# Patient Record
Sex: Female | Born: 1960 | Race: White | Hispanic: No | Marital: Single | Smoking: Current every day smoker
Health system: Southern US, Community
[De-identification: ages and names within clinical notes are randomized; demographics above are authoritative.]

## PROBLEM LIST (undated history)

## (undated) DIAGNOSIS — M549 Dorsalgia, unspecified: Secondary | ICD-10-CM

## (undated) HISTORY — PX: TONSILLECTOMY: SUR1361

---

## 2005-12-22 ENCOUNTER — Other Ambulatory Visit: Admission: RE | Admit: 2005-12-22 | Discharge: 2005-12-22 | Payer: Self-pay | Admitting: Family Medicine

## 2010-10-18 ENCOUNTER — Telehealth: Payer: Self-pay | Admitting: Critical Care Medicine

## 2010-10-29 NOTE — Telephone Encounter (Signed)
ONO result noted  No desaturation

## 2011-07-14 ENCOUNTER — Encounter (HOSPITAL_COMMUNITY): Payer: Self-pay | Admitting: *Deleted

## 2011-07-14 ENCOUNTER — Emergency Department (HOSPITAL_COMMUNITY)
Admission: EM | Admit: 2011-07-14 | Discharge: 2011-07-14 | Disposition: A | Discharge status: Home / Self Care | Payer: BC Managed Care – PPO | Attending: Emergency Medicine | Admitting: Emergency Medicine

## 2011-07-14 ENCOUNTER — Emergency Department (HOSPITAL_COMMUNITY): Payer: BC Managed Care – PPO

## 2011-07-14 DIAGNOSIS — R42 Dizziness and giddiness: Secondary | ICD-10-CM | POA: Insufficient documentation

## 2011-07-14 DIAGNOSIS — R51 Headache: Secondary | ICD-10-CM | POA: Insufficient documentation

## 2011-07-14 DIAGNOSIS — M542 Cervicalgia: Secondary | ICD-10-CM | POA: Insufficient documentation

## 2011-07-14 DIAGNOSIS — R0789 Other chest pain: Secondary | ICD-10-CM | POA: Insufficient documentation

## 2011-07-14 HISTORY — DX: Dorsalgia, unspecified: M54.9

## 2011-07-14 LAB — COMPREHENSIVE METABOLIC PANEL
AST: 15 U/L (ref 0–37)
Albumin: 3.6 g/dL (ref 3.5–5.2)
BUN: 20 mg/dL (ref 6–23)
Calcium: 9.1 mg/dL (ref 8.4–10.5)
Chloride: 102 mEq/L (ref 96–112)
Creatinine, Ser: 0.54 mg/dL (ref 0.50–1.10)
Total Bilirubin: 0.2 mg/dL — ABNORMAL LOW (ref 0.3–1.2)
Total Protein: 7.2 g/dL (ref 6.0–8.3)

## 2011-07-14 LAB — DIFFERENTIAL
Basophils Absolute: 0.1 10*3/uL (ref 0.0–0.1)
Basophils Relative: 1 % (ref 0–1)
Eosinophils Absolute: 0.3 10*3/uL (ref 0.0–0.7)
Eosinophils Relative: 2 % (ref 0–5)
Monocytes Absolute: 0.9 10*3/uL (ref 0.1–1.0)
Monocytes Relative: 8 % (ref 3–12)
Neutro Abs: 5.4 10*3/uL (ref 1.7–7.7)

## 2011-07-14 LAB — CBC
HCT: 38.7 % (ref 36.0–46.0)
Hemoglobin: 13.2 g/dL (ref 12.0–15.0)
MCH: 31.1 pg (ref 26.0–34.0)
MCHC: 34.1 g/dL (ref 30.0–36.0)
MCV: 91.3 fL (ref 78.0–100.0)
RDW: 13.9 % (ref 11.5–15.5)

## 2011-07-14 LAB — TROPONIN I
Troponin I: <0.3 ng/mL (ref <0.30)
Troponin I: <0.3 ng/mL (ref <0.30)

## 2011-07-14 MED ORDER — TRAMADOL HCL 50 MG PO TABS: 50.0000 mg | ORAL_TABLET | Freq: Four times a day (QID) | ORAL | Status: AC | PRN

## 2011-07-14 MED ORDER — ACETAMINOPHEN 500 MG PO TABS: 1000.0000 mg | ORAL_TABLET | Freq: Once | ORAL | Status: DC

## 2011-07-14 MED ORDER — SODIUM CHLORIDE 0.9 % IV BOLUS (SEPSIS)
1000.0000 mL | Freq: Once | INTRAVENOUS | Status: AC
  Administered 2011-07-14: 1000 mL via INTRAVENOUS

## 2011-07-14 MED ORDER — ACETAMINOPHEN 325 MG PO TABS
ORAL_TABLET | ORAL | Status: AC
  Filled 2011-07-14: qty 3

## 2011-07-14 MED ORDER — ACETAMINOPHEN 325 MG PO TABS
975.0000 mg | ORAL_TABLET | Freq: Once | ORAL | Status: AC
  Administered 2011-07-14: 975 mg via ORAL

## 2011-07-14 NOTE — ED Notes (Signed)
Pt here per [facility] with multiple complaints this am.  Pt c/o CP that woke her around 2 am .  Complains of dizziness on and off x 1 month.  Both hands numb x 1 month. Neck pain and Headache also currently.

## 2011-07-14 NOTE — ED Notes (Signed)
MD at bedside. 

## 2011-07-14 NOTE — ED Provider Notes (Signed)
History     CSN: [id number]  Arrival date & time 07/14/11  2536   First MD Initiated Contact with Patient 07/14/11 [phone]      Chief Complaint  Patient presents with  . Chest Pain  . Dizziness  . Headache    HPI The patient presents with chest pressure.  She notes that for the past 6 hours she has had pressure diffusely across her anterior chest.  The sensation developed gradually, as she was trying to sleep.  Since onset the sensation has been persistent, now with radiation superiorly.  She notes ongoing mild dyspnea.  No syncope, but the patient complains of lightheadedness.  She denies any fevers, chills, cough, pleuritic or exertional changes. The patient has one similar prior episode, that resolved spontaneously.  No cardiac imaging.  The patient endorses a history of depression.  Notably, the patient is currently being weaned from narcotic therapy, and was discharged by her primary care physician with instructions to follow up at a pain clinic. The patient smokes half-pack cigarettes daily. Past Medical History  Diagnosis Date  . Back pain     No past surgical history on file.  No family history on file.  History  Substance Use Topics  . Smoking status: Not on file  . Smokeless tobacco: Not on file  . Alcohol Use: 0.6 oz/week    1 Glasses of wine per week    OB History    Grav Para Term Preterm Abortions TAB SAB Ect Mult Living                  Review of Systems  Constitutional:       HPI  HENT:       HPI otherwise negative  Eyes: Negative.   Respiratory:       HPI, otherwise negative  Cardiovascular:       HPI, otherwise nmegative  Gastrointestinal: Negative for vomiting.  Genitourinary:       HPI, otherwise negative  Musculoskeletal:       HPI, otherwise negative  Skin: Negative.   Neurological: Negative for syncope.    Allergies  Review of patient's allergies indicates no known allergies.  Home Medications   Current Outpatient Rx  Name Route  Sig Dispense Refill  . HYDROCODONE-ACETAMINOPHEN 7.5-325 MG PO TABS Oral Take 1 tablet by mouth every 4 (four) hours as needed. For pain    . SERTRALINE HCL 100 MG PO TABS Oral Take 200 mg by mouth daily.      BP 124/74  Pulse 73  Temp(Src) 98 F (36.7 C) (Oral)  Resp 18  Ht 5\' 3"  (1.6 m)  Wt 165 lb (74.844 kg)  BMI 29.23 kg/m2  SpO2 96%  Physical Exam  Nursing note and vitals reviewed. Constitutional: She is oriented to person, place, and time. She appears well-developed and well-nourished. No distress.  HENT:  Head: Normocephalic and atraumatic.  Eyes: Conjunctivae and EOM are normal.  Cardiovascular: Normal rate and regular rhythm.   Pulmonary/Chest: Effort normal and breath sounds normal. No stridor. No respiratory distress.  Abdominal: She exhibits no distension.  Musculoskeletal: She exhibits no edema.  Neurological: She is alert and oriented to person, place, and time. No cranial nerve deficit.  Skin: Skin is warm and dry.  Psychiatric: She has a normal mood and affect.    ED Course  Procedures (including critical care time)  Labs Reviewed  CBC - Abnormal; Notable for the following:    WBC 11.5 (*)  All other components within normal limits  DIFFERENTIAL - Abnormal; Notable for the following:    Lymphs Abs 4.9 (*)    All other components within normal limits  COMPREHENSIVE METABOLIC PANEL  TROPONIN I   No results found.   No diagnosis found.  Cardiac 75 sinus rhythm normal Pulse oximetry 99% room air normal  Date: 07/14/2011  Rate: 74  Rhythm: normal sinus rhythm  QRS Axis: left  Intervals: normal  ST/T Wave abnormalities: nonspecific T wave changes  Conduction Disutrbances:none  Narrative Interpretation:   Old EKG Reviewed: none available  ABNORMAL ECG  MDM  This 51 year old female now presents with concerns of chest discomfort and neck pain.  Notably, the patient has a long history of chronic pain, and is currently in discussions with her  primary care physician regarding the necessity of management the pain specialists.  On my exam the patient is in no distress.  Given the patient's denial of any significant cardiac history, the denial of any exertional or pleuritic pain, nonischemic ECG and 2 negative troponins there is low suspicion for ongoing cardiac ischemia.  Throughout the patient's emergency room stay she was resting comfortably, in no distress with unremarkable vital signs.  Given the patient's smoking history, her description of generalized chest discomfort and mild dyspnea her some suspicion for emphysematous changes.          Gerhard Munch, MD 07/14/11 1121

## 2011-07-14 NOTE — Discharge Instructions (Signed)
It is very important that you continue to have your condition managed by your primary care physician.  As he instructed, you also need to be sure to follow-up in the pain management clinic.  Your evaluation today in the ED is most consistent with worsening lung function (likely related to your smoking history)  Please be sure to stop smoking, and to discuss further evaluation of your lungs with your physician.  If you develop any new, or concerning changes please return to the ED immediately.

## 2012-05-21 ENCOUNTER — Telehealth: Payer: Self-pay | Admitting: Nurse Practitioner

## 2012-05-21 NOTE — Telephone Encounter (Signed)
Patient was seen by Crystal Mata on 12/05/11 and now needs refills of her Zoloft 100mg  twice daily and Valium 5mg  twice daily

## 2012-05-22 ENCOUNTER — Other Ambulatory Visit: Payer: Self-pay | Admitting: Nurse Practitioner

## 2012-05-22 MED ORDER — DIAZEPAM 5 MG PO TABS: 5.0000 mg | ORAL_TABLET | Freq: Two times a day (BID) | ORAL | Status: DC | PRN

## 2012-05-22 NOTE — Telephone Encounter (Signed)
Please advise 

## 2012-05-22 NOTE — Telephone Encounter (Signed)
rx for valium. Not done yet. Called yesterday. 5 mg 2 times a day

## 2012-05-22 NOTE — Telephone Encounter (Signed)
rx ready for pick up. Tell patient to let pharmacy request next time so we can see when was filled last

## 2012-05-29 ENCOUNTER — Telehealth: Payer: Self-pay | Admitting: Physician Assistant

## 2012-05-29 NOTE — Telephone Encounter (Signed)
Called [facility] and ask if they would inform the patient ,if she calls or comes by, her Rx is ready here at our office but we have an incorrect phone number.

## 2012-05-30 NOTE — Telephone Encounter (Signed)
Prescription is still upfront.  Pharmacy has been notified to instruct patient to pick up prescription at our office if she calls back again.

## 2012-06-04 NOTE — Telephone Encounter (Signed)
Pt aware rx up front to pick up.  

## 2012-07-06 ENCOUNTER — Other Ambulatory Visit: Payer: Self-pay | Admitting: Nurse Practitioner

## 2012-07-09 ENCOUNTER — Other Ambulatory Visit: Payer: Self-pay | Admitting: *Deleted

## 2012-07-09 NOTE — Telephone Encounter (Signed)
Patient last seen in office on 12-02-11. Rx last filled on 06-04-12 for #[id number]. Please advise and if approved have nurse phone in to [facility] in [place]. Thank you

## 2012-07-09 NOTE — Telephone Encounter (Signed)
Ok to call in [employer]- Pt needs to be seen for future refills

## 2012-07-09 NOTE — Telephone Encounter (Signed)
Med called in and pt aware 

## 2012-08-08 ENCOUNTER — Other Ambulatory Visit: Payer: Self-pay | Admitting: Nurse Practitioner

## 2012-08-09 NOTE — Telephone Encounter (Signed)
Last filled 07/06/12, has appt 08/17/12 with you. Have nurse call into [facility]

## 2012-08-09 NOTE — Telephone Encounter (Signed)
Please call in diazepam rx

## 2012-08-09 NOTE — Telephone Encounter (Signed)
Called into pharmacy

## 2012-08-17 ENCOUNTER — Ambulatory Visit: Payer: Self-pay | Admitting: Nurse Practitioner

## 2012-09-05 ENCOUNTER — Other Ambulatory Visit: Payer: Self-pay | Admitting: Nurse Practitioner

## 2012-09-07 NOTE — Telephone Encounter (Signed)
Please call into pharmacy. thx 

## 2012-09-07 NOTE — Telephone Encounter (Signed)
Last seen 12/02/11   [facility]   If approved phone in and have nurse notify patient

## 2012-09-13 ENCOUNTER — Telehealth: Payer: Self-pay | Admitting: Nurse Practitioner

## 2012-09-18 ENCOUNTER — Telehealth: Payer: Self-pay | Admitting: Nurse Practitioner

## 2012-09-18 NOTE — Telephone Encounter (Signed)
Ok to refill valium X1 until sees MAE HAliburtin

## 2012-09-18 NOTE — Telephone Encounter (Signed)
Refill

## 2012-09-19 NOTE — Telephone Encounter (Signed)
Rx called to [facility], pt aware.

## 2012-10-01 ENCOUNTER — Ambulatory Visit: Payer: Self-pay | Admitting: General Practice

## 2012-10-11 ENCOUNTER — Ambulatory Visit: Payer: Self-pay | Admitting: General Practice

## 2012-10-16 ENCOUNTER — Other Ambulatory Visit: Payer: Self-pay | Admitting: Nurse Practitioner

## 2012-10-16 MED ORDER — DIAZEPAM 5 MG PO TABS: ORAL_TABLET | ORAL | Status: DC

## 2012-10-16 MED ORDER — SERTRALINE HCL 100 MG PO TABS: 200.0000 mg | ORAL_TABLET | Freq: Every day | ORAL | Status: DC

## 2012-10-16 NOTE — Telephone Encounter (Signed)
LAST OV 10/13. NTBS. LAST RF ON DIAZEPAM 09/05/12.CALL IN THE DRUG STORE IF APPROVED. THANKS.

## 2012-10-16 NOTE — Telephone Encounter (Signed)
CALL DRUG AND ASK TO FAX OVER RX REQUEST.

## 2012-10-16 NOTE — Telephone Encounter (Signed)
Please all in rx for valium but tell patient no more refills until seen

## 2012-10-16 NOTE — Telephone Encounter (Signed)
Called Valium to [facility]

## 2012-10-30 ENCOUNTER — Ambulatory Visit: Payer: Self-pay | Admitting: General Practice

## 2012-11-29 ENCOUNTER — Other Ambulatory Visit: Payer: Self-pay | Admitting: *Deleted

## 2012-11-29 NOTE — Telephone Encounter (Signed)
Patient has not been seen in office since we started Epic. Patient notified back in July that she NTBS. Gave refill once because an appt was made for 10-01-12 with Mae. Patient cancelled that appt and had appt for 8/14 and also for 9/2. She was a No show for both. Please advise. If approved please route to Pool B so nurse can phone in to [facility].

## 2012-12-20 ENCOUNTER — Other Ambulatory Visit: Payer: Self-pay

## 2012-12-21 ENCOUNTER — Telehealth: Payer: Self-pay | Admitting: Nurse Practitioner

## 2012-12-24 NOTE — Telephone Encounter (Signed)
Last seen 10/13

## 2012-12-25 NOTE — Telephone Encounter (Signed)
[place] but has not been seen since 10 /2013- must be seen to get meds

## 2012-12-25 NOTE — Telephone Encounter (Signed)
[place] Coaster R spoke with pt on 10-27.- done

## 2012-12-26 ENCOUNTER — Other Ambulatory Visit: Payer: Self-pay | Admitting: *Deleted

## 2012-12-26 MED ORDER — SERTRALINE HCL 100 MG PO TABS: 200.0000 mg | ORAL_TABLET | Freq: Every day | ORAL | Status: DC

## 2012-12-26 MED ORDER — DIAZEPAM 5 MG PO TABS: ORAL_TABLET | ORAL | Status: DC

## 2012-12-26 NOTE — Telephone Encounter (Signed)
Please call in valium rx 

## 2012-12-26 NOTE — Telephone Encounter (Signed)
REFUSED EARLIER THIS MONTH BUT LOST JOB AND CANT AFFORD TO COME IN TO BE SEEN. SEE NOTES. CALL IN THE DRUG STORE IF APPROVED. THANKS.

## 2012-12-26 NOTE — Telephone Encounter (Signed)
LAST OV 12/02/11. NTBS

## 2012-12-26 NOTE — Telephone Encounter (Signed)
NTBS.

## 2012-12-27 NOTE — Telephone Encounter (Signed)
RX called in .

## 2013-03-21 NOTE — Telephone Encounter (Signed)
[facility]

## 2013-08-16 ENCOUNTER — Ambulatory Visit (HOSPITAL_COMMUNITY)
Admission: RE | Admit: 2013-08-16 | Discharge: 2013-08-16 | Disposition: A | Discharge status: Home / Self Care | Payer: Disability Insurance | Source: Ambulatory Visit | Attending: Family Medicine | Admitting: Family Medicine

## 2013-08-16 ENCOUNTER — Other Ambulatory Visit (HOSPITAL_COMMUNITY): Payer: Self-pay | Admitting: Family Medicine

## 2013-08-16 DIAGNOSIS — M25539 Pain in unspecified wrist: Secondary | ICD-10-CM | POA: Insufficient documentation

## 2013-08-16 DIAGNOSIS — M542 Cervicalgia: Secondary | ICD-10-CM

## 2013-08-16 DIAGNOSIS — M79641 Pain in right hand: Secondary | ICD-10-CM

## 2013-08-16 DIAGNOSIS — M47817 Spondylosis without myelopathy or radiculopathy, lumbosacral region: Secondary | ICD-10-CM | POA: Insufficient documentation

## 2013-08-16 DIAGNOSIS — M79609 Pain in unspecified limb: Secondary | ICD-10-CM | POA: Insufficient documentation

## 2013-08-16 DIAGNOSIS — M79642 Pain in left hand: Secondary | ICD-10-CM

## 2013-08-16 DIAGNOSIS — M545 Low back pain: Secondary | ICD-10-CM

## 2013-08-16 DIAGNOSIS — M47812 Spondylosis without myelopathy or radiculopathy, cervical region: Secondary | ICD-10-CM | POA: Insufficient documentation

## 2014-10-29 IMAGING — CR DG HAND COMPLETE 3+V*R*
3 series · 3 of 3 positions shown · non-contrast
Comparison: None.

CLINICAL DATA: Bilateral hand and wrist pain for years with no
known injury

EXAM:
RIGHT HAND - COMPLETE 3+ VIEW

[view not recorded (1 of 3)]
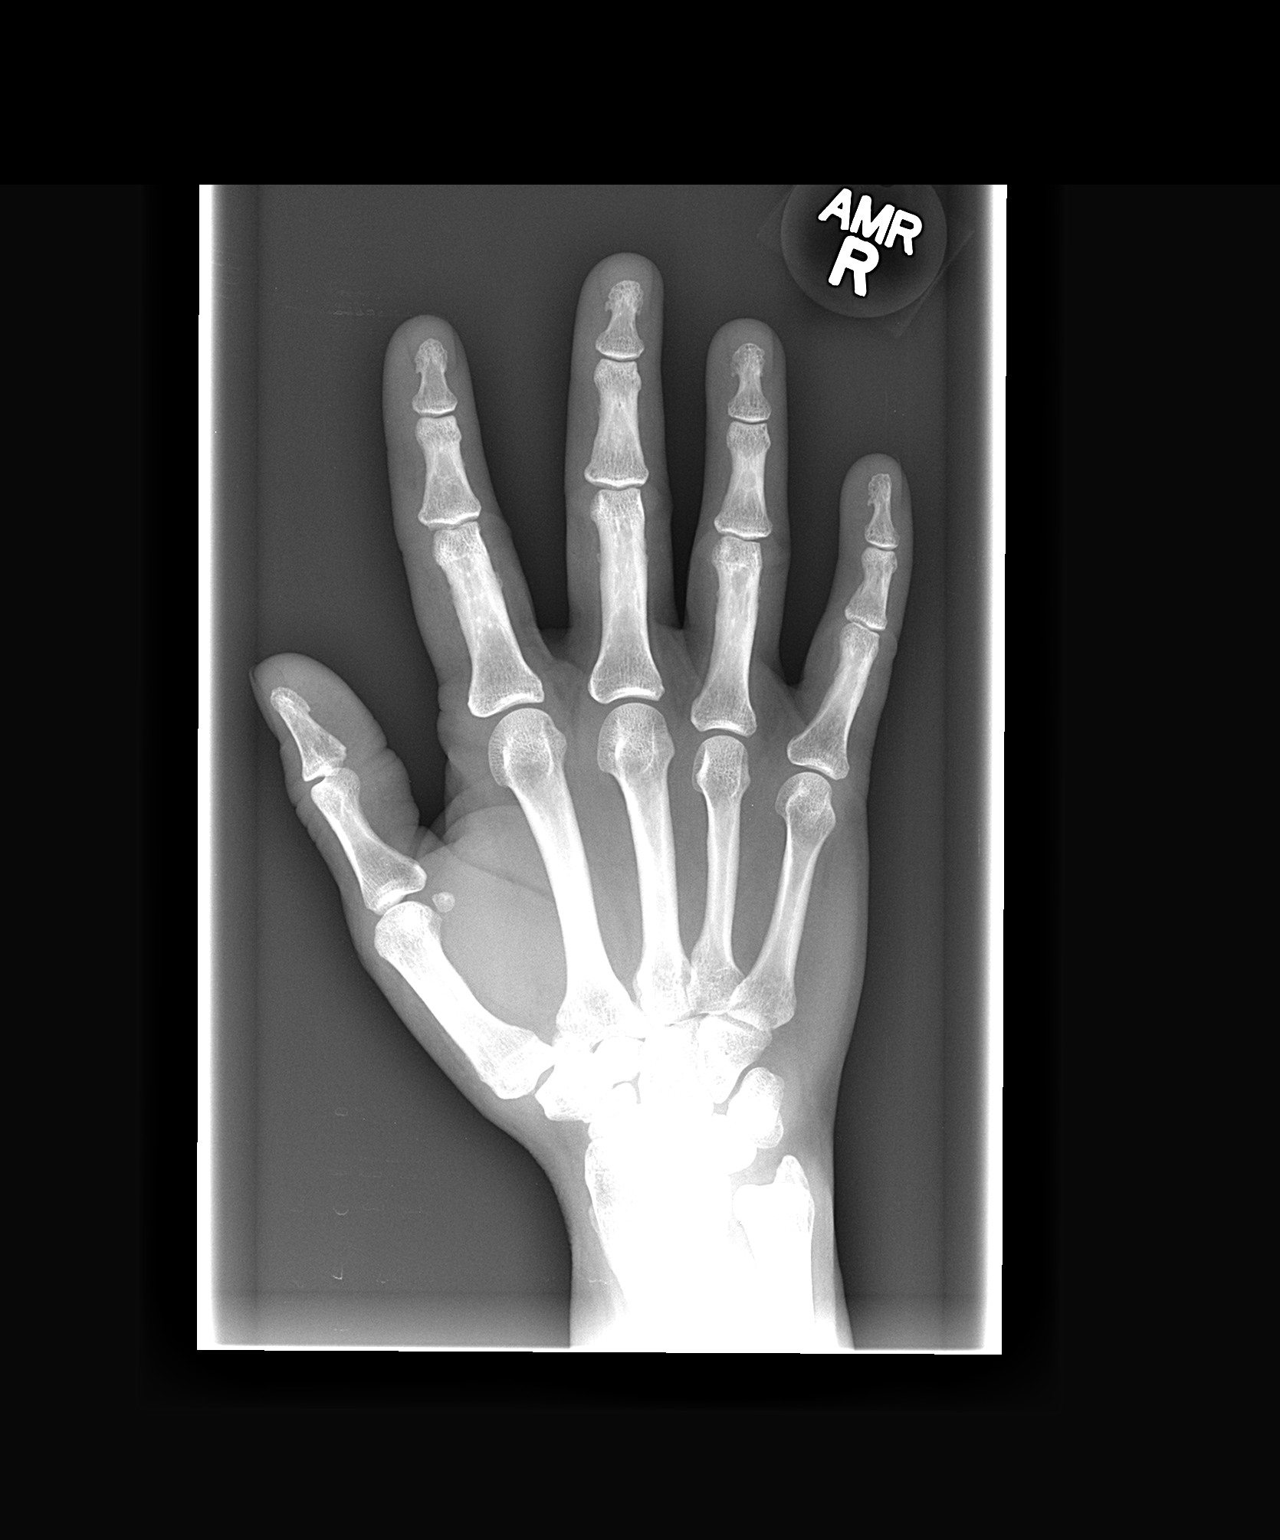

[view not recorded (2 of 3)]
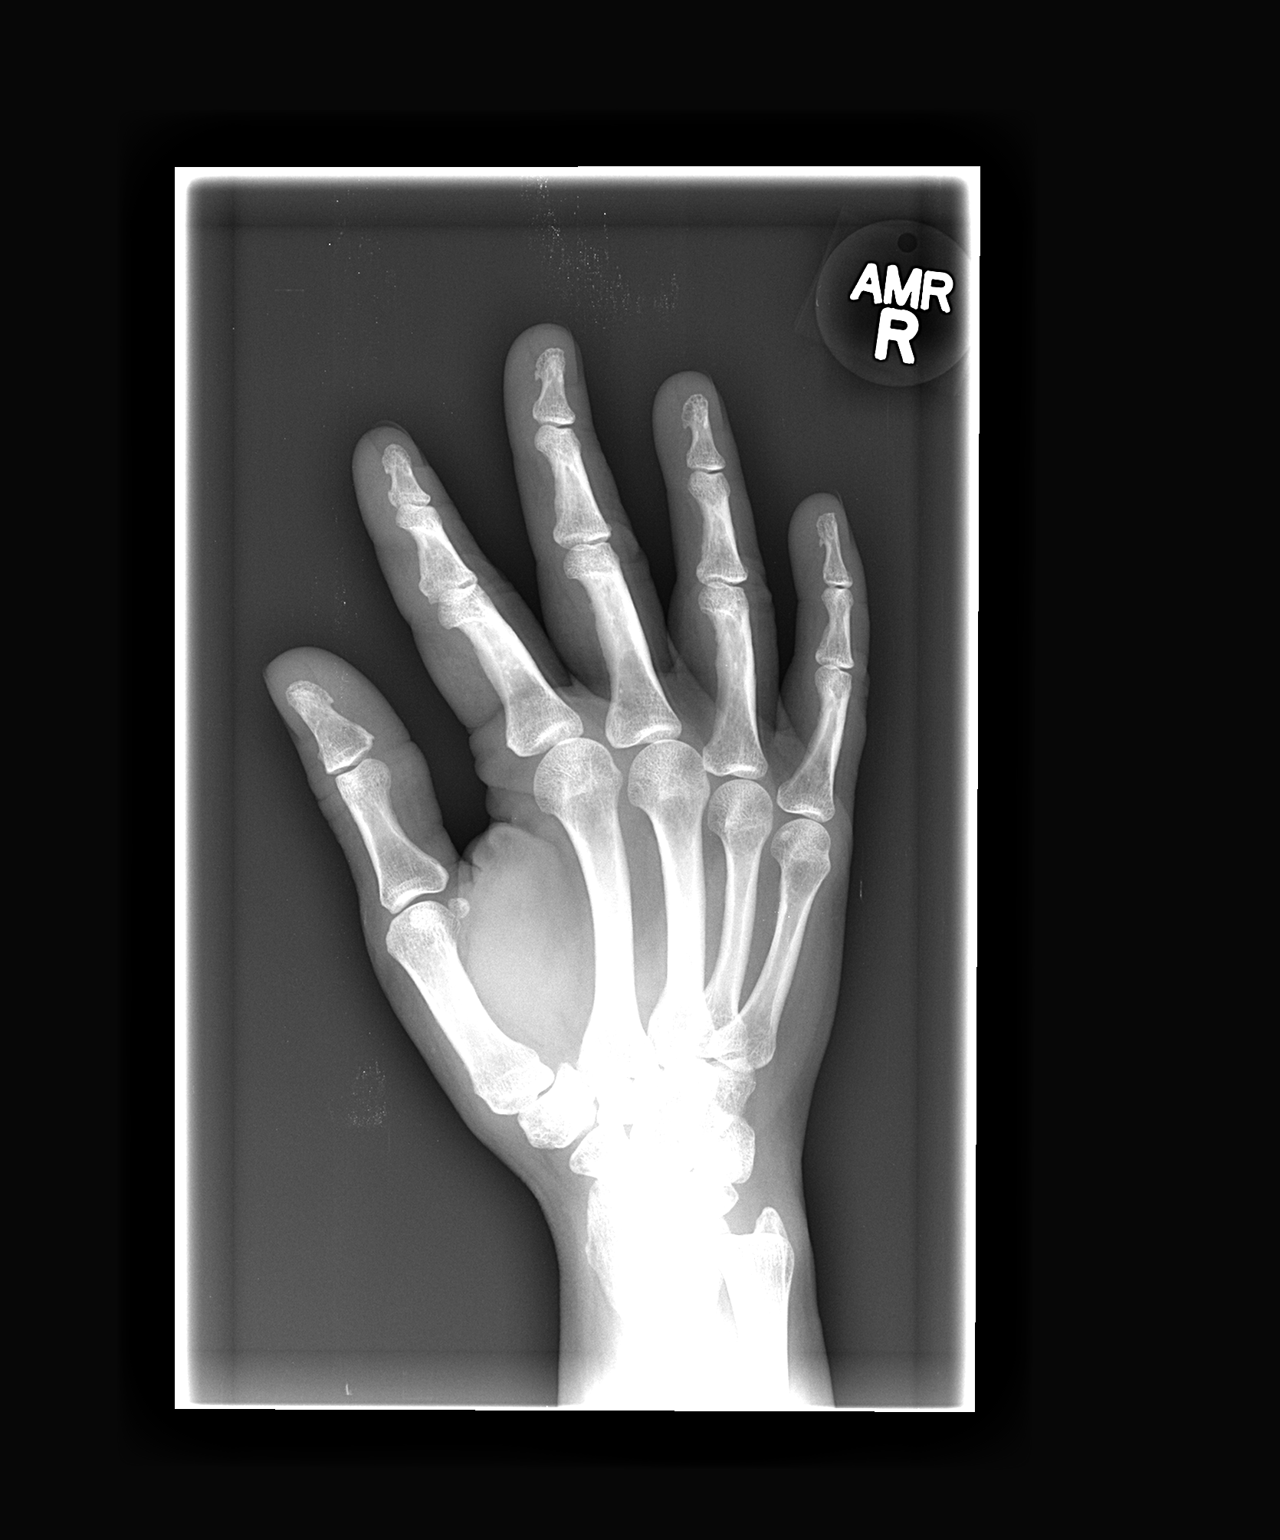

[view not recorded (3 of 3)]
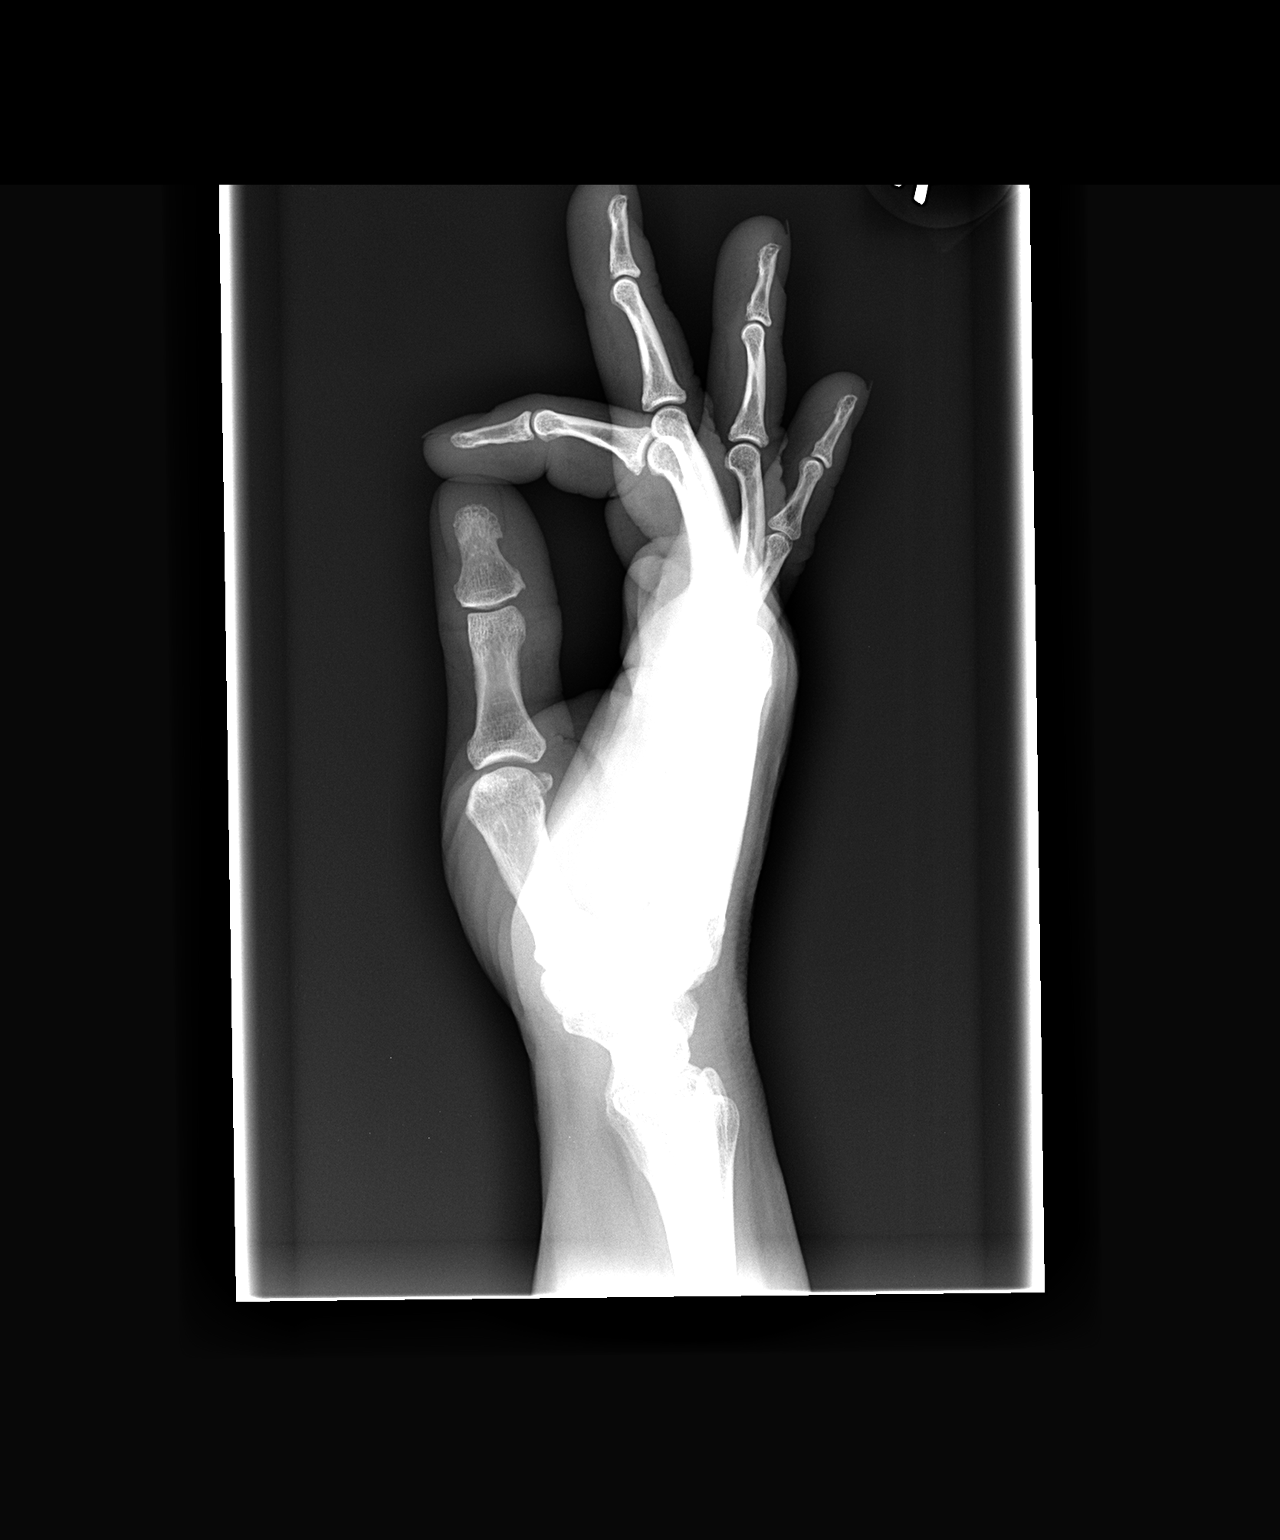

[3 of 3 positions shown; findings below may reference images not displayed]

FINDINGS: There is no fracture or dislocation. There is no focal soft tissue
abnormality. There is no significant arthropathy appreciated.
IMPRESSION: Negative

## 2016-09-23 ENCOUNTER — Ambulatory Visit (INDEPENDENT_AMBULATORY_CARE_PROVIDER_SITE_OTHER): Payer: Self-pay | Admitting: Nurse Practitioner

## 2016-09-23 ENCOUNTER — Encounter: Payer: Self-pay | Admitting: Nurse Practitioner

## 2016-09-23 VITALS — BP 120/78 | HR 83 | Temp 97.2°F | Ht 63.0 in | Wt 158.0 lb

## 2016-09-23 DIAGNOSIS — F419 Anxiety disorder, unspecified: Secondary | ICD-10-CM

## 2016-09-23 DIAGNOSIS — F331 Major depressive disorder, recurrent, moderate: Secondary | ICD-10-CM

## 2016-09-23 MED ORDER — ESCITALOPRAM OXALATE 10 MG PO TABS: 10.0000 mg | ORAL_TABLET | Freq: Every day | ORAL | 5 refills | Status: DC

## 2016-09-23 MED ORDER — ALPRAZOLAM 0.25 MG PO TABS: 0.2500 mg | ORAL_TABLET | Freq: Two times a day (BID) | ORAL | 0 refills | Status: DC | PRN

## 2016-09-23 NOTE — Patient Instructions (Signed)
Stress and Stress Management Stress is a normal reaction to life events. It is what you feel when life demands more than you are used to or more than you can handle. Some stress can be useful. For example, the stress reaction can help you catch the last bus of the day, study for a test, or meet a deadline at work. But stress that occurs too often or for too long can cause problems. It can affect your emotional health and interfere with relationships and normal daily activities. Too much stress can weaken your immune system and increase your risk for physical illness. If you already have a medical problem, stress can make it worse. What are the causes? All sorts of life events may cause stress. An event that causes stress for one person may not be stressful for another person. Major life events commonly cause stress. These may be positive or negative. Examples include losing your job, moving into a new home, getting married, having a baby, or losing a loved one. Less obvious life events may also cause stress, especially if they occur day after day or in combination. Examples include working long hours, driving in traffic, caring for children, being in debt, or being in a difficult relationship. What are the signs or symptoms? Stress may cause emotional symptoms including, the following:  Anxiety. This is feeling worried, afraid, on edge, overwhelmed, or out of control.  Anger. This is feeling irritated or impatient.  Depression. This is feeling sad, down, helpless, or guilty.  Difficulty focusing, remembering, or making decisions.  Stress may cause physical symptoms, including the following:  Aches and pains. These may affect your head, neck, back, stomach, or other areas of your body.  Tight muscles or clenched jaw.  Low energy or trouble sleeping.  Stress may cause unhealthy behaviors, including the following:  Eating to feel better (overeating) or skipping meals.  Sleeping too little,  too much, or both.  Working too much or putting off tasks (procrastination).  Smoking, drinking alcohol, or using drugs to feel better.  How is this diagnosed? Stress is diagnosed through an assessment by your health care provider. Your health care provider will ask questions about your symptoms and any stressful life events.Your health care provider will also ask about your medical history and may order blood tests or other tests. Certain medical conditions and medicine can cause physical symptoms similar to stress. Mental illness can cause emotional symptoms and unhealthy behaviors similar to stress. Your health care provider may refer you to a mental health professional for further evaluation. How is this treated? Stress management is the recommended treatment for stress.The goals of stress management are reducing stressful life events and coping with stress in healthy ways. Techniques for reducing stressful life events include the following:  Stress identification. Self-monitor for stress and identify what causes stress for you. These skills may help you to avoid some stressful events.  Time management. Set your priorities, keep a calendar of events, and learn to say "no." These tools can help you avoid making too many commitments.  Techniques for coping with stress include the following:  Rethinking the problem. Try to think realistically about stressful events rather than ignoring them or overreacting. Try to find the positives in a stressful situation rather than focusing on the negatives.  Exercise. Physical exercise can release both physical and emotional tension. The key is to find a form of exercise you enjoy and do it regularly.  Relaxation techniques. These relax the body and  mind. Examples include yoga, meditation, tai chi, biofeedback, deep breathing, progressive muscle relaxation, listening to music, being out in nature, journaling, and other hobbies. Again, the key is to find  one or more that you enjoy and can do regularly.  Healthy lifestyle. Eat a balanced diet, get plenty of sleep, and do not smoke. Avoid using alcohol or drugs to relax.  Strong support network. Spend time with family, friends, or other people you enjoy being around.Express your feelings and talk things over with someone you trust.  Counseling or talktherapy with a mental health professional may be helpful if you are having difficulty managing stress on your own. Medicine is typically not recommended for the treatment of stress.Talk to your health care provider if you think you need medicine for symptoms of stress. Follow these instructions at home:  Keep all follow-up visits as directed by your health care provider.  Take all medicines as directed by your health care provider. Contact a health care provider if:  Your symptoms get worse or you start having new symptoms.  You feel overwhelmed by your problems and can no longer manage them on your own. Get help right away if:  You feel like hurting yourself or someone else. This information is not intended to replace advice given to you by your health care provider. Make sure you discuss any questions you have with your health care provider. Document Released: 08/10/2000 Document Revised: 07/23/2015 Document Reviewed: 10/09/2012 Elsevier Interactive Patient Education  2017 Elsevier Inc.  
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
## 2016-10-03 ENCOUNTER — Telehealth: Payer: Self-pay | Admitting: Nurse Practitioner

## 2016-10-03 NOTE — Telephone Encounter (Signed)
MMM,  Pt needs a letter stating that she needs a pet / cat for therapeutic reasons.

## 2016-10-04 NOTE — Telephone Encounter (Signed)
Please write [atientletter that her cat serves as [employer] animal for her anxiety

## 2016-10-04 NOTE — Telephone Encounter (Signed)
Letter written and pt aware it is left up front.

## 2016-10-14 ENCOUNTER — Ambulatory Visit (INDEPENDENT_AMBULATORY_CARE_PROVIDER_SITE_OTHER): Payer: Self-pay | Admitting: Nurse Practitioner

## 2016-10-14 ENCOUNTER — Encounter: Payer: Self-pay | Admitting: Nurse Practitioner

## 2016-10-14 VITALS — BP 126/79 | HR 75 | Temp 97.3°F | Ht 63.0 in | Wt 157.6 lb

## 2016-10-14 DIAGNOSIS — F3341 Major depressive disorder, recurrent, in partial remission: Secondary | ICD-10-CM

## 2016-10-14 MED ORDER — CITALOPRAM HYDROBROMIDE 40 MG PO TABS: 40.0000 mg | ORAL_TABLET | Freq: Every day | ORAL | 5 refills | Status: DC
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
## 2016-10-14 NOTE — Patient Instructions (Signed)
Stress and Stress Management Stress is a normal reaction to life events. It is what you feel when life demands more than you are used to or more than you can handle. Some stress can be useful. For example, the stress reaction can help you catch the last bus of the day, study for a test, or meet a deadline at work. But stress that occurs too often or for too long can cause problems. It can affect your emotional health and interfere with relationships and normal daily activities. Too much stress can weaken your immune system and increase your risk for physical illness. If you already have a medical problem, stress can make it worse. What are the causes? All sorts of life events may cause stress. An event that causes stress for one person may not be stressful for another person. Major life events commonly cause stress. These may be positive or negative. Examples include losing your job, moving into a new home, getting married, having a baby, or losing a loved one. Less obvious life events may also cause stress, especially if they occur day after day or in combination. Examples include working long hours, driving in traffic, caring for children, being in debt, or being in a difficult relationship. What are the signs or symptoms? Stress may cause emotional symptoms including, the following:  Anxiety. This is feeling worried, afraid, on edge, overwhelmed, or out of control.  Anger. This is feeling irritated or impatient.  Depression. This is feeling sad, down, helpless, or guilty.  Difficulty focusing, remembering, or making decisions.  Stress may cause physical symptoms, including the following:  Aches and pains. These may affect your head, neck, back, stomach, or other areas of your body.  Tight muscles or clenched jaw.  Low energy or trouble sleeping.  Stress may cause unhealthy behaviors, including the following:  Eating to feel better (overeating) or skipping meals.  Sleeping too little,  too much, or both.  Working too much or putting off tasks (procrastination).  Smoking, drinking alcohol, or using drugs to feel better.  How is this diagnosed? Stress is diagnosed through an assessment by your health care provider. Your health care provider will ask questions about your symptoms and any stressful life events.Your health care provider will also ask about your medical history and may order blood tests or other tests. Certain medical conditions and medicine can cause physical symptoms similar to stress. Mental illness can cause emotional symptoms and unhealthy behaviors similar to stress. Your health care provider may refer you to a mental health professional for further evaluation. How is this treated? Stress management is the recommended treatment for stress.The goals of stress management are reducing stressful life events and coping with stress in healthy ways. Techniques for reducing stressful life events include the following:  Stress identification. Self-monitor for stress and identify what causes stress for you. These skills may help you to avoid some stressful events.  Time management. Set your priorities, keep a calendar of events, and learn to say "no." These tools can help you avoid making too many commitments.  Techniques for coping with stress include the following:  Rethinking the problem. Try to think realistically about stressful events rather than ignoring them or overreacting. Try to find the positives in a stressful situation rather than focusing on the negatives.  Exercise. Physical exercise can release both physical and emotional tension. The key is to find a form of exercise you enjoy and do it regularly.  Relaxation techniques. These relax the body and  mind. Examples include yoga, meditation, tai chi, biofeedback, deep breathing, progressive muscle relaxation, listening to music, being out in nature, journaling, and other hobbies. Again, the key is to find  one or more that you enjoy and can do regularly.  Healthy lifestyle. Eat a balanced diet, get plenty of sleep, and do not smoke. Avoid using alcohol or drugs to relax.  Strong support network. Spend time with family, friends, or other people you enjoy being around.Express your feelings and talk things over with someone you trust.  Counseling or talktherapy with a mental health professional may be helpful if you are having difficulty managing stress on your own. Medicine is typically not recommended for the treatment of stress.Talk to your health care provider if you think you need medicine for symptoms of stress. Follow these instructions at home:  Keep all follow-up visits as directed by your health care provider.  Take all medicines as directed by your health care provider. Contact a health care provider if:  Your symptoms get worse or you start having new symptoms.  You feel overwhelmed by your problems and can no longer manage them on your own. Get help right away if:  You feel like hurting yourself or someone else. This information is not intended to replace advice given to you by your health care provider. Make sure you discuss any questions you have with your health care provider. Document Released: 08/10/2000 Document Revised: 07/23/2015 Document Reviewed: 10/09/2012 Elsevier Interactive Patient Education  2017 Elsevier Inc.  

## 2016-11-14 ENCOUNTER — Telehealth: Payer: Self-pay | Admitting: Nurse Practitioner

## 2016-11-14 NOTE — Telephone Encounter (Signed)
Has to be seen to get controlled substances

## 2016-11-15 NOTE — Telephone Encounter (Signed)
Patient states that she does not have any days off this week and will call back to make an appt.

## 2016-12-21 ENCOUNTER — Telehealth: Payer: Self-pay | Admitting: Nurse Practitioner

## 2016-12-22 MED ORDER — ESCITALOPRAM OXALATE 20 MG PO TABS: 20.0000 mg | ORAL_TABLET | Freq: Every day | ORAL | 5 refills | Status: DC

## 2016-12-22 NOTE — Telephone Encounter (Signed)
Changed from celexa 40mg   To lexapro 20mg . 20mg  Is max dose of lexapro and it usually works well.

## 2016-12-22 NOTE — Telephone Encounter (Signed)
Pt aware.

## 2017-04-17 ENCOUNTER — Other Ambulatory Visit: Payer: Self-pay

## 2017-04-17 MED ORDER — ESCITALOPRAM OXALATE 20 MG PO TABS: 20.0000 mg | ORAL_TABLET | Freq: Every day | ORAL | 0 refills | Status: DC

## 2017-04-17 NOTE — Telephone Encounter (Signed)
Last seen 8/18  MMM

## 2017-04-17 NOTE — Telephone Encounter (Signed)
Last seen 10/14/16  MMM 

## 2017-04-20 ENCOUNTER — Encounter: Payer: Self-pay | Admitting: Family

## 2017-04-20 ENCOUNTER — Ambulatory Visit (INDEPENDENT_AMBULATORY_CARE_PROVIDER_SITE_OTHER): Payer: Self-pay | Admitting: Family

## 2017-04-20 VITALS — BP 130/78 | HR 83 | Temp 97.6°F | Ht 63.0 in | Wt 179.0 lb

## 2017-04-20 DIAGNOSIS — J209 Acute bronchitis, unspecified: Secondary | ICD-10-CM

## 2017-04-20 MED ORDER — HYDROCODONE-HOMATROPINE 5-1.5 MG/5ML PO SYRP: 5.0000 mL | ORAL_SOLUTION | Freq: Three times a day (TID) | ORAL | 0 refills | Status: DC | PRN

## 2017-04-20 MED ORDER — PREDNISONE 10 MG (21) PO TBPK
ORAL_TABLET | ORAL | 0 refills | Status: DC
Start: 2017-04-20 — End: 2017-12-25

## 2017-04-20 NOTE — Patient Instructions (Signed)

## 2017-04-20 NOTE — Progress Notes (Signed)
   Subjective:    Patient ID: Crystal Mata, female    DOB: 04/25/1960, 57 y.o.   MRN: [id number]  PT presents to the office today with complaints of cough that started over two weeks ago. Pt reports her son-in-law was diagnosed with the flu about the same time her symptoms started.  Cough  This is a new problem. The current episode started 1 to 4 weeks ago. The problem has been waxing and waning. The problem occurs every few minutes. The cough is non-productive. Associated symptoms include chills, headaches, myalgias, nasal congestion, rhinorrhea and a sore throat. Pertinent negatives include no ear congestion, ear pain or fever. The symptoms are aggravated by lying down. She has tried rest for the symptoms. The treatment provided mild relief. There is no history of asthma or COPD.      Review of Systems  Constitutional: Positive for chills. Negative for fever.  HENT: Positive for rhinorrhea and sore throat. Negative for ear pain.   Respiratory: Positive for cough.   Musculoskeletal: Positive for myalgias.  Neurological: Positive for headaches.  All other systems reviewed and are negative.      Objective:   Physical Exam  Constitutional: She is oriented to person, place, and time. She appears well-developed and well-nourished. No distress.  HENT:  Head: Normocephalic and atraumatic.  Right Ear: External ear normal.  Left Ear: External ear normal.  Nose: Mucosal edema and rhinorrhea present.  Mouth/Throat: Posterior oropharyngeal erythema present.  Eyes: Pupils are equal, round, and reactive to light.  Neck: Normal range of motion. Neck supple. No thyromegaly present.  Cardiovascular: Normal rate, regular rhythm, normal heart sounds and intact distal pulses.  No murmur heard. Pulmonary/Chest: Effort normal. No respiratory distress. She has wheezes.  Abdominal: Soft. Bowel sounds are normal. She exhibits no distension. There is no tenderness.  Musculoskeletal: Normal range of  motion. She exhibits no edema or tenderness.  Neurological: She is alert and oriented to person, place, and time. She has normal reflexes. No cranial nerve deficit.  Skin: Skin is warm and dry.  Psychiatric: She has a normal mood and affect. Her behavior is normal. Judgment and thought content normal.  Vitals reviewed.     BP 130/78   Pulse 83   Temp 97.6 F (36.4 C) (Oral)   Ht 5\' 3"  (1.6 m)   Wt 179 lb (81.2 kg)   BMI 31.71 kg/m      Assessment & Plan:  1. Acute bronchitis, unspecified organism - Take meds as prescribed - Use a cool mist humidifier  -Use saline nose sprays frequently -Force fluids -For any cough or congestion  Use plain Mucinex- regular strength or max strength is fine -For fever or aces or pains- take tylenol or ibuprofen. -Throat lozenges if help -New toothbrush in 3 days RTO prn  - predniSONE (STERAPRED UNI-PAK 21 TAB) 10 MG (21) TBPK tablet; Use as directed  Dispense: 21 tablet; Refill: 0 - HYDROcodone-homatropine (HYCODAN) 5-1.5 MG/5ML syrup; Take 5 mLs by mouth every 8 (eight) hours as needed for cough.  Dispense: 120 mL; Refill: 0    Jannifer Rodneyhristy Thierno Hun, FNP

## 2017-05-12 ENCOUNTER — Other Ambulatory Visit: Payer: Self-pay | Admitting: *Deleted

## 2017-06-19 ENCOUNTER — Other Ambulatory Visit: Payer: Self-pay | Admitting: *Deleted

## 2017-07-19 ENCOUNTER — Other Ambulatory Visit: Payer: Self-pay | Admitting: Nurse Practitioner

## 2017-07-21 ENCOUNTER — Other Ambulatory Visit: Payer: Self-pay | Admitting: Nurse Practitioner

## 2017-07-25 ENCOUNTER — Other Ambulatory Visit: Payer: Self-pay | Admitting: Nurse Practitioner

## 2017-07-25 NOTE — Telephone Encounter (Signed)
Lexapro was refilled on 5/24 and receipt confirmation received. Patient aware and will make appt for follow up soon.

## 2017-08-16 ENCOUNTER — Other Ambulatory Visit: Payer: Self-pay | Admitting: Nurse Practitioner

## 2017-08-16 MED ORDER — ESCITALOPRAM OXALATE 20 MG PO TABS: 20.0000 mg | ORAL_TABLET | Freq: Every day | ORAL | 0 refills | Status: DC

## 2017-08-16 NOTE — Telephone Encounter (Signed)
Pt aware 30 day refill has been sent to pharmacy

## 2017-08-17 NOTE — Telephone Encounter (Signed)
Last seen 04/20/17

## 2017-09-18 ENCOUNTER — Ambulatory Visit: Payer: Self-pay | Admitting: Nurse Practitioner

## 2017-11-20 ENCOUNTER — Other Ambulatory Visit: Payer: Self-pay | Admitting: Nurse Practitioner

## 2017-11-20 NOTE — Telephone Encounter (Signed)
Patient  Notified NTBS

## 2017-11-20 NOTE — Telephone Encounter (Signed)
MMM. NTBS. 30 day given 08/16/17. July appt cancelled

## 2017-12-13 ENCOUNTER — Telehealth: Payer: Self-pay | Admitting: *Deleted

## 2017-12-13 MED ORDER — ESCITALOPRAM OXALATE 20 MG PO TABS: 20.0000 mg | ORAL_TABLET | Freq: Every day | ORAL | 0 refills | Status: DC

## 2017-12-13 NOTE — Telephone Encounter (Signed)
Aware refill sent 

## 2017-12-25 ENCOUNTER — Encounter: Payer: Self-pay | Admitting: Nurse Practitioner

## 2017-12-25 ENCOUNTER — Ambulatory Visit (INDEPENDENT_AMBULATORY_CARE_PROVIDER_SITE_OTHER): Payer: Self-pay | Admitting: Nurse Practitioner

## 2017-12-25 VITALS — BP 118/70 | HR 94 | Temp 97.0°F | Ht 63.0 in | Wt 186.0 lb

## 2017-12-25 DIAGNOSIS — F419 Anxiety disorder, unspecified: Secondary | ICD-10-CM | POA: Insufficient documentation

## 2017-12-25 DIAGNOSIS — F410 Panic disorder [episodic paroxysmal anxiety] without agoraphobia: Secondary | ICD-10-CM | POA: Insufficient documentation

## 2017-12-25 DIAGNOSIS — F339 Major depressive disorder, recurrent, unspecified: Secondary | ICD-10-CM

## 2017-12-25 MED ORDER — ESCITALOPRAM OXALATE 20 MG PO TABS: 20.0000 mg | ORAL_TABLET | Freq: Every day | ORAL | 0 refills | Status: DC

## 2017-12-25 MED ORDER — BUSPIRONE HCL 10 MG PO TABS: 10.0000 mg | ORAL_TABLET | Freq: Two times a day (BID) | ORAL | 1 refills | Status: DC

## 2017-12-25 NOTE — Patient Instructions (Signed)
Stress and Stress Management Stress is a normal reaction to life events. It is what you feel when life demands more than you are used to or more than you can handle. Some stress can be useful. For example, the stress reaction can help you catch the last bus of the day, study for a test, or meet a deadline at work. But stress that occurs too often or for too long can cause problems. It can affect your emotional health and interfere with relationships and normal daily activities. Too much stress can weaken your immune system and increase your risk for physical illness. If you already have a medical problem, stress can make it worse. What are the causes? All sorts of life events may cause stress. An event that causes stress for one person may not be stressful for another person. Major life events commonly cause stress. These may be positive or negative. Examples include losing your job, moving into a new home, getting married, having a baby, or losing a loved one. Less obvious life events may also cause stress, especially if they occur day after day or in combination. Examples include working long hours, driving in traffic, caring for children, being in debt, or being in a difficult relationship. What are the signs or symptoms? Stress may cause emotional symptoms including, the following:  Anxiety. This is feeling worried, afraid, on edge, overwhelmed, or out of control.  Anger. This is feeling irritated or impatient.  Depression. This is feeling sad, down, helpless, or guilty.  Difficulty focusing, remembering, or making decisions.  Stress may cause physical symptoms, including the following:  Aches and pains. These may affect your head, neck, back, stomach, or other areas of your body.  Tight muscles or clenched jaw.  Low energy or trouble sleeping.  Stress may cause unhealthy behaviors, including the following:  Eating to feel better (overeating) or skipping meals.  Sleeping too little,  too much, or both.  Working too much or putting off tasks (procrastination).  Smoking, drinking alcohol, or using drugs to feel better.  How is this diagnosed? Stress is diagnosed through an assessment by your health care provider. Your health care provider will ask questions about your symptoms and any stressful life events.Your health care provider will also ask about your medical history and may order blood tests or other tests. Certain medical conditions and medicine can cause physical symptoms similar to stress. Mental illness can cause emotional symptoms and unhealthy behaviors similar to stress. Your health care provider may refer you to a mental health professional for further evaluation. How is this treated? Stress management is the recommended treatment for stress.The goals of stress management are reducing stressful life events and coping with stress in healthy ways. Techniques for reducing stressful life events include the following:  Stress identification. Self-monitor for stress and identify what causes stress for you. These skills may help you to avoid some stressful events.  Time management. Set your priorities, keep a calendar of events, and learn to say "no." These tools can help you avoid making too many commitments.  Techniques for coping with stress include the following:  Rethinking the problem. Try to think realistically about stressful events rather than ignoring them or overreacting. Try to find the positives in a stressful situation rather than focusing on the negatives.  Exercise. Physical exercise can release both physical and emotional tension. The key is to find a form of exercise you enjoy and do it regularly.  Relaxation techniques. These relax the body and  mind. Examples include yoga, meditation, tai chi, biofeedback, deep breathing, progressive muscle relaxation, listening to music, being out in nature, journaling, and other hobbies. Again, the key is to find  one or more that you enjoy and can do regularly.  Healthy lifestyle. Eat a balanced diet, get plenty of sleep, and do not smoke. Avoid using alcohol or drugs to relax.  Strong support network. Spend time with family, friends, or other people you enjoy being around.Express your feelings and talk things over with someone you trust.  Counseling or talktherapy with a mental health professional may be helpful if you are having difficulty managing stress on your own. Medicine is typically not recommended for the treatment of stress.Talk to your health care provider if you think you need medicine for symptoms of stress. Follow these instructions at home:  Keep all follow-up visits as directed by your health care provider.  Take all medicines as directed by your health care provider. Contact a health care provider if:  Your symptoms get worse or you start having new symptoms.  You feel overwhelmed by your problems and can no longer manage them on your own. Get help right away if:  You feel like hurting yourself or someone else. This information is not intended to replace advice given to you by your health care provider. Make sure you discuss any questions you have with your health care provider. Document Released: 08/10/2000 Document Revised: 07/23/2015 Document Reviewed: 10/09/2012 Elsevier Interactive Patient Education  2017 Elsevier Inc.  

## 2017-12-25 NOTE — Progress Notes (Signed)
   Subjective:    Patient ID: Crystal Mata, female    DOB: May 20, 1960, 57 y.o.   MRN: [id number]   Chief Complaint: Medical Management of Chronic Issues   HPI:  1. Depression, recurrent (HCC)  Patient is currently on lexapro. Not working as well as it was. Still getting anxious and having panic attacks. She is under a lot of stress that is causing the attacks. Having panic attack at least 3x a week. Usually situational.  2. Anxiety  She has been out of xanax for awhile . We had given it to her temporarily while waitin for lexapro to take affect.    Outpatient Encounter Medications as of 12/25/2017  Medication Sig  . escitalopram (LEXAPRO) 20 MG tablet Take 1 tablet (20 mg total) by mouth daily.  Marland Kitchen ALPRAZolam (XANAX) 0.25 MG tablet Take 1 tablet (0.25 mg total) by mouth 2 (two) times daily as needed for anxiety. (Patient not taking: Reported on 12/25/2017)       New complaints: None today  Social history: Works at [employer] for her family   Review of Systems  Constitutional: Negative for activity change and appetite change.  HENT: Negative.   Eyes: Negative for pain.  Respiratory: Negative for shortness of breath.   Cardiovascular: Negative for chest pain, palpitations and leg swelling.  Gastrointestinal: Negative for abdominal pain.  Endocrine: Negative for polydipsia.  Genitourinary: Negative.   Skin: Negative for rash.  Neurological: Negative for dizziness, weakness and headaches.  Hematological: Does not bruise/bleed easily.  Psychiatric/Behavioral: Negative.   All other systems reviewed and are negative.      Objective:   Physical Exam  Constitutional: She appears well-developed and well-nourished. No distress.  Cardiovascular: Normal rate and regular rhythm.  Pulmonary/Chest: Effort normal and breath sounds normal.  Neurological: She is alert.  Skin: Skin is warm.  Psychiatric: She has a normal mood and affect. Her behavior is normal. Thought  content normal.  Nursing note and vitals reviewed.  BP 118/70   Pulse 94   Temp (!) 97 F (36.1 C) (Oral)   Ht 5\' 3"  (1.6 m)   Wt 186 lb (84.4 kg)   BMI 32.95 kg/m         Assessment & Plan:  Crystal Mata in today with chief complaint of Medical Management of Chronic Issues   1. Depression, recurrent (HCC) - escitalopram (LEXAPRO) 20 MG tablet; Take 1 tablet (20 mg total) by mouth daily.  Dispense: 30 tablet; Refill: 0  2. Anxiety - busPIRone (BUSPAR) 10 MG tablet; Take 1 tablet (10 mg total) by mouth 2 (two) times daily.  Dispense: 60 tablet; Refill: 1  3. Panic attack Stress management exercise  RTO Prn  Mary-Margaret Daphine Deutscher, FNP

## 2018-01-16 ENCOUNTER — Other Ambulatory Visit: Payer: Self-pay | Admitting: Nurse Practitioner

## 2018-01-16 DIAGNOSIS — F419 Anxiety disorder, unspecified: Secondary | ICD-10-CM

## 2018-02-08 ENCOUNTER — Other Ambulatory Visit: Payer: Self-pay | Admitting: Nurse Practitioner

## 2018-02-08 MED ORDER — CLONAZEPAM 0.5 MG PO TABS
0.5000 mg | ORAL_TABLET | Freq: Two times a day (BID) | ORAL | 1 refills | Status: DC | PRN
Start: 2018-02-08 — End: 2018-02-19

## 2018-02-08 NOTE — Telephone Encounter (Signed)
Pt aware and scheduled for f/u 12/23 with MMM.

## 2018-02-08 NOTE — Telephone Encounter (Signed)
Will send in klonopin low dose for 1 week but will need to be seen for refill

## 2018-02-08 NOTE — Telephone Encounter (Signed)
Please review and advise.

## 2018-02-19 ENCOUNTER — Ambulatory Visit (INDEPENDENT_AMBULATORY_CARE_PROVIDER_SITE_OTHER): Payer: Self-pay | Admitting: Nurse Practitioner

## 2018-02-19 ENCOUNTER — Encounter: Payer: Self-pay | Admitting: Nurse Practitioner

## 2018-02-19 VITALS — BP 144/79 | HR 73 | Temp 97.1°F | Ht 63.0 in | Wt 188.0 lb

## 2018-02-19 DIAGNOSIS — F339 Major depressive disorder, recurrent, unspecified: Secondary | ICD-10-CM

## 2018-02-19 DIAGNOSIS — F410 Panic disorder [episodic paroxysmal anxiety] without agoraphobia: Secondary | ICD-10-CM

## 2018-02-19 DIAGNOSIS — F419 Anxiety disorder, unspecified: Secondary | ICD-10-CM

## 2018-02-19 MED ORDER — CLONAZEPAM 0.5 MG PO TABS: 0.5000 mg | ORAL_TABLET | Freq: Two times a day (BID) | ORAL | 5 refills | Status: DC | PRN

## 2018-02-19 MED ORDER — ESCITALOPRAM OXALATE 20 MG PO TABS: 20.0000 mg | ORAL_TABLET | Freq: Every day | ORAL | 0 refills | Status: DC

## 2018-02-19 NOTE — Progress Notes (Signed)
Subjective:    Patient ID: Crystal Mata Chestnutt, female    DOB: 10/24/1960, 57 y.o.   MRN: [id number]   Chief Complaint: Medical Management of Chronic Issues   HPI:  1. Panic attack   2. Depression, recurrent (HCC)   3. Anxiety     Outpatient Encounter Medications as of 02/19/2018  Medication Sig  . clonazePAM (KLONOPIN) 0.5 MG tablet Take 1 tablet (0.5 mg total) by mouth 2 (two) times daily as needed for anxiety.  Marland Kitchen. escitalopram (LEXAPRO) 20 MG tablet Take 1 tablet (20 mg total) by mouth daily.   No facility-administered encounter medications on file as of 02/19/2018.    * patient is doing well today. She is currently on lexapro and klonopin. She has been taking klonopin daily usually early in morning. It really helps her stress level throughout the day.  [facility] 02/19/2018 12/25/2017 04/20/2017  Decreased Interest 1 2 0  Down, Depressed, Hopeless 1 2 0  PHQ - 2 Score 2 4 0  Altered sleeping 1 2 -  Tired, decreased energy 1 2 -  Change in appetite 1 2 -  Feeling bad or failure about yourself  1 3 -  Trouble concentrating 1 2 -  Moving slowly or fidgety/restless 1 3 -  Suicidal thoughts 0 0 -  PHQ-9 Score 8 18 -  Difficult doing work/chores - - -      New complaints: None today  Social history: Works as Administratorcook and waitress in [employer]   Review of Systems  Constitutional: Negative for activity change and appetite change.  HENT: Negative.   Eyes: Negative for pain.  Respiratory: Negative for shortness of breath.   Cardiovascular: Negative for chest pain, palpitations and leg swelling.  Gastrointestinal: Negative for abdominal pain.  Endocrine: Negative for polydipsia.  Genitourinary: Negative.   Skin: Negative for rash.  Neurological: Negative for dizziness, weakness and headaches.  Hematological: Does not bruise/bleed easily.  Psychiatric/Behavioral: Negative.   All other systems reviewed and are negative.      Objective:   Physical  Exam Constitutional:      Appearance: Normal appearance. She is obese.  Neck:     Musculoskeletal: Normal range of motion.  Cardiovascular:     Rate and Rhythm: Normal rate and regular rhythm.     Pulses: Normal pulses.     Heart sounds: Normal heart sounds.  Skin:    General: Skin is warm and dry.  Neurological:     General: No focal deficit present.     Mental Status: She is alert and oriented to person, place, and time.  Psychiatric:        Mood and Affect: Mood normal.        Behavior: Behavior normal.        Thought Content: Thought content normal.        Judgment: Judgment normal.    BP (!) 144/79   Pulse 73   Temp (!) 97.1 F (36.2 C) (Oral)   Ht 5\' 3"  (1.6 m)   Wt 188 lb (85.3 kg)   BMI 33.30 kg/m          Assessment & Plan:  Crystal Mata Muniz comes in today with chief complaint of Medical Management of Chronic Issues   Diagnosis and orders addressed:  1. Panic attack  2. Depression, recurrent (HCC) Stress management - escitalopram (LEXAPRO) 20 MG tablet; Take 1 tablet (20 mg total) by mouth daily.  Dispense: 30 tablet; Refill: 0  3. Anxiety -  clonazePAM (KLONOPIN) 0.5 MG tablet; Take 1 tablet (0.5 mg total) by mouth 2 (two) times daily as needed for anxiety.  Dispense: 30 tablet; Refill: 5   Labs pending Health Maintenance reviewed Diet and exercise encouraged  Follow up plan: 3 months   Mary-Margaret Daphine DeutscherMartin, FNP

## 2018-02-19 NOTE — Patient Instructions (Signed)

## 2018-04-13 ENCOUNTER — Other Ambulatory Visit: Payer: Self-pay | Admitting: Nurse Practitioner

## 2018-04-13 DIAGNOSIS — F339 Major depressive disorder, recurrent, unspecified: Secondary | ICD-10-CM

## 2018-06-12 ENCOUNTER — Other Ambulatory Visit: Payer: Self-pay | Admitting: Nurse Practitioner

## 2018-06-12 DIAGNOSIS — F339 Major depressive disorder, recurrent, unspecified: Secondary | ICD-10-CM

## 2018-07-07 ENCOUNTER — Other Ambulatory Visit: Payer: Self-pay | Admitting: Nurse Practitioner

## 2018-07-07 DIAGNOSIS — F339 Major depressive disorder, recurrent, unspecified: Secondary | ICD-10-CM

## 2018-07-09 NOTE — Telephone Encounter (Signed)
Needs to be seen

## 2018-08-09 ENCOUNTER — Other Ambulatory Visit: Payer: Self-pay | Admitting: Nurse Practitioner

## 2018-08-09 DIAGNOSIS — F339 Major depressive disorder, recurrent, unspecified: Secondary | ICD-10-CM

## 2018-08-16 ENCOUNTER — Other Ambulatory Visit: Payer: Self-pay | Admitting: Nurse Practitioner

## 2018-08-16 DIAGNOSIS — F339 Major depressive disorder, recurrent, unspecified: Secondary | ICD-10-CM

## 2018-08-16 DIAGNOSIS — F419 Anxiety disorder, unspecified: Secondary | ICD-10-CM

## 2018-08-17 NOTE — Telephone Encounter (Signed)
MMM. NTBS 30 days given 07/09/18

## 2018-08-17 NOTE — Telephone Encounter (Signed)
lmtcb to schedule appt 

## 2018-08-21 ENCOUNTER — Other Ambulatory Visit: Payer: Self-pay | Admitting: Nurse Practitioner

## 2018-08-21 ENCOUNTER — Telehealth: Payer: Self-pay | Admitting: Nurse Practitioner

## 2018-08-21 DIAGNOSIS — F339 Major depressive disorder, recurrent, unspecified: Secondary | ICD-10-CM

## 2018-08-21 DIAGNOSIS — F419 Anxiety disorder, unspecified: Secondary | ICD-10-CM

## 2018-08-23 MED ORDER — CLONAZEPAM 0.5 MG PO TABS: 0.5000 mg | ORAL_TABLET | Freq: Two times a day (BID) | ORAL | 5 refills | Status: DC | PRN

## 2018-08-23 NOTE — Telephone Encounter (Signed)
Refilled klonopin

## 2018-09-03 ENCOUNTER — Ambulatory Visit (INDEPENDENT_AMBULATORY_CARE_PROVIDER_SITE_OTHER): Payer: Self-pay | Admitting: Nurse Practitioner

## 2018-09-03 ENCOUNTER — Encounter: Payer: Self-pay | Admitting: Nurse Practitioner

## 2018-09-03 ENCOUNTER — Other Ambulatory Visit: Payer: Self-pay

## 2018-09-03 DIAGNOSIS — F419 Anxiety disorder, unspecified: Secondary | ICD-10-CM

## 2018-09-03 DIAGNOSIS — F410 Panic disorder [episodic paroxysmal anxiety] without agoraphobia: Secondary | ICD-10-CM

## 2018-09-03 DIAGNOSIS — F339 Major depressive disorder, recurrent, unspecified: Secondary | ICD-10-CM

## 2018-09-03 MED ORDER — ESCITALOPRAM OXALATE 20 MG PO TABS: 20.0000 mg | ORAL_TABLET | Freq: Every day | ORAL | 5 refills | Status: DC

## 2018-09-03 MED ORDER — BUPROPION HCL ER (XL) 150 MG PO TB24: 150.0000 mg | ORAL_TABLET | Freq: Every day | ORAL | 5 refills | Status: DC

## 2018-09-03 NOTE — Progress Notes (Signed)
   Virtual Visit via telephone Note  I connected with Crystal Mata on 09/03/18 at 2:45 by telephone and verified that I am speaking with the correct person using two identifiers. Crystal Mata is currently located at home and  Non one is currently with her during visit. The provider, [name] Done, FNP is located in their office at time of visit.  I discussed the limitations, risks, security and privacy concerns of performing an evaluation and management service by telephone and the availability of in person appointments. I also discussed with the patient that there may be a patient responsible charge related to this service. The patient expressed understanding and agreed to proceed.   History and Present Illness:   Chief Complaint: Medical Management of Chronic Issues    HPI:  1. Depression, recurrent ([place])  2. Anxiety  3. Panic attack  * patient is having a hard time right now. Her daughter has been very disrespecful to her and her boss at work has been giving her a hard time. They all work together which makes things more difficult.   Outpatient Encounter Medications as of 09/03/2018  Medication Sig  . clonazePAM (KLONOPIN) 0.5 MG tablet Take 1 tablet (0.5 mg total) by mouth 2 (two) times daily as needed for anxiety.  Marland Kitchen escitalopram (LEXAPRO) 20 MG tablet TAKE 1 TABLET (20 MG TOTAL) BY MOUTH DAILY. (NEEDS TO BE SEEN BEFORE NEXT REFILL)        New complaints: none  Social history: Works at [employer] with her family  Controlled substance contract: need to get the next time she come sin to office.     Review of Systems  Constitutional: Negative.   Respiratory: Negative.   Cardiovascular: Negative.   Genitourinary: Negative.   Skin: Negative.   Neurological: Negative.   Psychiatric/Behavioral: Negative.   All other systems reviewed and are negative.    Observations/Objective: Alert and oriented- ask all questions appropriately patient very tearful   Assessment and Plan: Crystal Mata comes in today with chief complaint of Medical Management of Chronic Issues   Diagnosis and orders addressed:  1. Depression, recurrent ([place]) stress management - buPROPion (WELLBUTRIN XL) 150 MG 24 hr tablet; Take 1 tablet (150 mg total) by mouth daily.  Dispense: 30 tablet; Refill: 5 - escitalopram (LEXAPRO) 20 MG tablet; Take 1 tablet (20 mg total) by mouth daily. (Needs to be seen before next refill)  Dispense: 30 tablet; Refill: 5  2. Anxiety  3. Panic attack   Labs pending Health Maintenance reviewed Diet and exercise encouraged  Follow up plan: 6 months     I discussed the assessment and treatment plan with the patient. The patient was provided an opportunity to ask questions and all were answered. The patient agreed with the plan and demonstrated an understanding of the instructions.   The patient was advised to call back or seek an in-person evaluation if the symptoms worsen or if the condition fails to improve as anticipated.  The above assessment and management plan was discussed with the patient. The patient verbalized understanding of and has agreed to the management plan. Patient is aware to call the clinic if symptoms persist or worsen. Patient is aware when to return to the clinic for a follow-up visit. Patient educated on when it is appropriate to go to the emergency department.   Time call ended:  3:00  I provided 15 minutes of non-face-to-face time during this encounter.    [name] Done, FNP

## 2018-09-08 ENCOUNTER — Other Ambulatory Visit: Payer: Self-pay | Admitting: Nurse Practitioner

## 2018-09-08 DIAGNOSIS — F339 Major depressive disorder, recurrent, unspecified: Secondary | ICD-10-CM

## 2019-04-16 ENCOUNTER — Other Ambulatory Visit: Payer: Self-pay | Admitting: Nurse Practitioner

## 2019-04-16 DIAGNOSIS — F339 Major depressive disorder, recurrent, unspecified: Secondary | ICD-10-CM

## 2019-05-13 ENCOUNTER — Other Ambulatory Visit: Payer: Self-pay | Admitting: Nurse Practitioner

## 2019-05-13 DIAGNOSIS — F339 Major depressive disorder, recurrent, unspecified: Secondary | ICD-10-CM

## 2019-05-27 ENCOUNTER — Other Ambulatory Visit: Payer: Self-pay | Admitting: Nurse Practitioner

## 2019-05-27 DIAGNOSIS — F339 Major depressive disorder, recurrent, unspecified: Secondary | ICD-10-CM

## 2019-05-28 ENCOUNTER — Other Ambulatory Visit: Payer: Self-pay | Admitting: Nurse Practitioner

## 2019-05-28 DIAGNOSIS — F339 Major depressive disorder, recurrent, unspecified: Secondary | ICD-10-CM

## 2019-05-28 MED ORDER — ESCITALOPRAM OXALATE 20 MG PO TABS: 20.0000 mg | ORAL_TABLET | Freq: Every day | ORAL | 0 refills | Status: DC

## 2019-05-28 NOTE — Telephone Encounter (Signed)
°  Prescription Request  05/28/2019  What is the name of the medication or equipment? escitalopram (LEXAPRO) 20 MG tablet   Have you contacted your pharmacy to request a refill? (if applicable) Yes  Which pharmacy would you like this sent to? [facility]    Patient notified that their request is being sent to the clinical staff for review and that they should receive a response within 2 business days.

## 2019-05-28 NOTE — Telephone Encounter (Signed)
MMM NTBS 30 days given 04/16/19

## 2019-05-29 ENCOUNTER — Telehealth: Payer: Self-pay | Admitting: Nurse Practitioner

## 2019-05-29 DIAGNOSIS — F339 Major depressive disorder, recurrent, unspecified: Secondary | ICD-10-CM

## 2019-05-29 MED ORDER — ESCITALOPRAM OXALATE 20 MG PO TABS: 20.0000 mg | ORAL_TABLET | Freq: Every day | ORAL | 0 refills | Status: DC

## 2019-05-29 NOTE — Telephone Encounter (Signed)
[facility]

## 2019-06-07 ENCOUNTER — Ambulatory Visit: Payer: Self-pay | Admitting: Nurse Practitioner

## 2019-06-07 ENCOUNTER — Encounter: Payer: Self-pay | Admitting: Nurse Practitioner

## 2019-06-21 ENCOUNTER — Other Ambulatory Visit: Payer: Self-pay | Admitting: Nurse Practitioner

## 2019-06-21 DIAGNOSIS — F339 Major depressive disorder, recurrent, unspecified: Secondary | ICD-10-CM

## 2019-06-21 NOTE — Telephone Encounter (Signed)
MMM NTBS 30 days given 05/29/19

## 2019-06-25 MED ORDER — ESCITALOPRAM OXALATE 20 MG PO TABS: 20.0000 mg | ORAL_TABLET | Freq: Every day | ORAL | 0 refills | Status: DC

## 2019-06-25 NOTE — Addendum Note (Signed)
Addended by: Hessie Diener on: 06/25/2019 10:08 AM   Modules accepted: Orders

## 2019-06-25 NOTE — Telephone Encounter (Signed)
Pt has appt 07/08/19 with MMM. rx sent over for 30 days.

## 2019-07-08 ENCOUNTER — Other Ambulatory Visit: Payer: Self-pay

## 2019-07-08 ENCOUNTER — Ambulatory Visit (INDEPENDENT_AMBULATORY_CARE_PROVIDER_SITE_OTHER): Payer: Self-pay | Admitting: Nurse Practitioner

## 2019-07-08 ENCOUNTER — Encounter: Payer: Self-pay | Admitting: Nurse Practitioner

## 2019-07-08 VITALS — BP 131/83 | HR 81 | Temp 98.3°F | Resp 20 | Ht 63.0 in | Wt 173.0 lb

## 2019-07-08 DIAGNOSIS — F419 Anxiety disorder, unspecified: Secondary | ICD-10-CM

## 2019-07-08 DIAGNOSIS — F339 Major depressive disorder, recurrent, unspecified: Secondary | ICD-10-CM

## 2019-07-08 DIAGNOSIS — F410 Panic disorder [episodic paroxysmal anxiety] without agoraphobia: Secondary | ICD-10-CM

## 2019-07-08 MED ORDER — CLONAZEPAM 0.5 MG PO TABS: 0.5000 mg | ORAL_TABLET | Freq: Two times a day (BID) | ORAL | 5 refills | Status: DC | PRN

## 2019-07-08 MED ORDER — ESCITALOPRAM OXALATE 20 MG PO TABS
20.0000 mg | ORAL_TABLET | Freq: Every day | ORAL | 5 refills | Status: DC
Start: 2019-07-08 — End: 2019-08-26

## 2019-07-08 NOTE — Progress Notes (Signed)
Subjective:    Patient ID: Crystal Mata, female    DOB: 04/25/1960, 59 y.o.   MRN: [id number]   Chief Complaint: Medical Management of Chronic Issues (Worried about chronic cough and her throat)    HPI:  1. Anxiety 2. Depression, recurrent (HCC) 3. Panic attack Patient is currently on lexapro and klonopin daily. The combination is working well for her. Depression screen [facility] 2/9 07/08/2019 09/03/2018 02/19/2018 12/25/2017 04/20/2017  Decreased Interest 0 2 1 2  0  Down, Depressed, Hopeless 0 2 1 2  0  PHQ - 2 Score 0 4 2 4  0  Altered sleeping - 2 1 2  -  Tired, decreased energy - 3 1 2  -  Change in appetite - 2 1 2  -  Feeling bad or failure about yourself  - 2 1 3  -  Trouble concentrating - 2 1 2  -  Moving slowly or fidgety/restless - 2 1 3  -  Suicidal thoughts - 0 0 0 -  PHQ-9 Score - 17 8 18  -  Difficult doing work/chores - - - - -   GAD 7 : Generalized Anxiety Score 07/08/2019 09/03/2018 09/23/2016  Nervous, Anxious, on Edge 3 1 2   Control/stop worrying 1 1 3   Worry too much - different things 1 2 3   Trouble relaxing 3 1 2   Restless 0 0 3  Easily annoyed or irritable 0 2 3  Afraid - awful might happen 1 1 2   Total GAD 7 Score 9 8 18   Anxiety Difficulty Not difficult at all Somewhat difficult Very difficult      Outpatient Encounter Medications as of 07/08/2019  Medication Sig  . clonazePAM (KLONOPIN) 0.5 MG tablet Take 1 tablet (0.5 mg total) by mouth 2 (two) times daily as needed for anxiety.  escitalopram (LEXAPRO) 20 MG tablet Take 1 tablet (20 mg total) by mouth daily. (Needs to be seen before next refill     Past Surgical History:  Procedure Laterality Date  . TONSILLECTOMY      Family History  Problem Relation Age of Onset  . Hypertension Mother   . Breast cancer Mother        Partial mastecomy  . Heart attack Mother   . Diabetic kidney disease Father     New complaints: None today  Social history: Live with herself now  Controlled substance  contract: n/a    Review of Systems  Constitutional: Negative for diaphoresis.  Eyes: Negative for pain.  Respiratory: Negative for shortness of breath.   Cardiovascular: Negative for chest pain, palpitations and leg swelling.  Gastrointestinal: Negative for abdominal pain.  Endocrine: Negative for polydipsia.  Skin: Negative for rash.  Neurological: Negative for dizziness, weakness and headaches.  Hematological: Does not bruise/bleed easily.  All other systems reviewed and are negative.      Objective:   Physical Exam Vitals and nursing note reviewed.  Constitutional:      General: She is not in acute distress.    Appearance: Normal appearance. She is well-developed.  HENT:     Head: Normocephalic.     Nose: Nose normal.  Eyes:     Pupils: Pupils are equal, round, and reactive to light.  Neck:     Vascular: No carotid bruit or JVD.  Cardiovascular:     Rate and Rhythm: Normal rate and regular rhythm.     Heart sounds: Normal heart sounds.  Pulmonary:     Effort: Pulmonary effort is normal. No respiratory distress.     Breath sounds:  Normal breath sounds. No wheezing or rales.  Chest:     Chest wall: No tenderness.  Abdominal:     General: Bowel sounds are normal. There is no distension or abdominal bruit.     Palpations: Abdomen is soft. There is no hepatomegaly, splenomegaly, mass or pulsatile mass.     Tenderness: There is no abdominal tenderness.  Musculoskeletal:        General: Normal range of motion.     Cervical back: Normal range of motion and neck supple.  Lymphadenopathy:     Cervical: No cervical adenopathy.  Skin:    General: Skin is warm and dry.  Neurological:     Mental Status: She is alert and oriented to person, place, and time.     Deep Tendon Reflexes: Reflexes are normal and symmetric.  Psychiatric:        Behavior: Behavior normal.        Thought Content: Thought content normal.        Judgment: Judgment normal.    BP 131/83   Pulse 81    Temp 98.3 F (36.8 C) (Temporal)   Resp 20   Ht 5\' 3"  (1.6 m)   Wt 173 lb (78.5 kg)   SpO2 95%   BMI 30.65 kg/m         Assessment & Plan:  Crystal Mata in today with chief complaint of Medical Management of Chronic Issues (Worried about chronic cough and her throat)   1. Anxiety - clonazePAM (KLONOPIN) 0.5 MG tablet; Take 1 tablet (0.5 mg total) by mouth 2 (two) times daily as needed for anxiety.  Dispense: 30 tablet; Refill: 5  2. Depression, recurrent ([place]) - escitalopram (LEXAPRO) 20 MG tablet; Take 1 tablet (20 mg total) by mouth daily. (Needs to be seen before next refill  Dispense: 30 tablet; Refill: 5  3. Panic attack stress management meds as rx Follow up in 6 months  and prn    The above assessment and management plan was discussed with the patient. The patient verbalized understanding of and has agreed to the management plan. Patient is aware to call the clinic if symptoms persist or worsen. Patient is aware when to return to the clinic for a follow-up visit. Patient educated on when it is appropriate to go to the emergency department.   [name] Done, FNP

## 2019-07-08 NOTE — Patient Instructions (Signed)
Stress, Adult Stress is a normal reaction to life events. Stress is what you feel when life demands more than you are used to, or more than you think you can handle. Some stress can be useful, such as studying for a test or meeting a deadline at work. Stress that occurs too often or for too long can cause problems. It can affect your emotional health and interfere with relationships and normal daily activities. Too much stress can weaken your body's defense system (immune system) and increase your risk for physical illness. If you already have a medical problem, stress can make it worse. What are the causes? All sorts of life events can cause stress. An event that causes stress for one person may not be stressful for another person. Major life events, whether positive or negative, commonly cause stress. Examples include:  Losing a job or starting a new job.  Losing a loved one.  Moving to a new town or home.  Getting married or divorced.  Having a baby.  Getting injured or sick. Less obvious life events can also cause stress, especially if they occur day after day or in combination with each other. Examples include:  Working long hours.  Driving in traffic.  Caring for children.  Being in debt.  Being in a difficult relationship. What are the signs or symptoms? Stress can cause emotional symptoms, including:  Anxiety. This is feeling worried, afraid, on edge, overwhelmed, or out of control.  Anger, including irritation or impatience.  Depression. This is feeling sad, down, helpless, or guilty.  Trouble focusing, remembering, or making decisions. Stress can cause physical symptoms, including:  Aches and pains. These may affect your head, neck, back, stomach, or other areas of your body.  Tight muscles or a clenched jaw.  Low energy.  Trouble sleeping. Stress can cause unhealthy behaviors, including:  Eating to feel better (overeating) or skipping meals.  Working too  much or putting off tasks.  Smoking, drinking alcohol, or using drugs to feel better. How is this diagnosed? Stress is diagnosed through an assessment by your health care provider. He or she may diagnose this condition based on:  Your symptoms and any stressful life events.  Your medical history.  Tests to rule out other causes of your symptoms. Depending on your condition, your health care provider may refer you to a specialist for further evaluation. How is this treated?  Stress management techniques are the recommended treatment for stress. Medicine is not typically recommended for the treatment of stress. Techniques to reduce your reaction to stressful life events include:  Stress identification. Monitor yourself for symptoms of stress and identify what causes stress for you. These skills may help you to avoid or prepare for stressful events.  Time management. Set your priorities, keep a calendar of events, and learn to say no. Taking these actions can help you avoid making too many commitments. Techniques for coping with stress include:  Rethinking the problem. Try to think realistically about stressful events rather than ignoring them or overreacting. Try to find the positives in a stressful situation rather than focusing on the negatives.  Exercise. Physical exercise can release both physical and emotional tension. The key is to find a form of exercise that you enjoy and do it regularly.  Relaxation techniques. These relax the body and mind. The key is to find one or more that you enjoy and use the techniques regularly. Examples include: ? Meditation, deep breathing, or progressive relaxation techniques. ? Yoga or   tai chi. ? Biofeedback, mindfulness techniques, or journaling. ? Listening to music, being out in nature, or participating in other hobbies.  Practicing a healthy lifestyle. Eat a balanced diet, drink plenty of water, limit or avoid caffeine, and get plenty of sleep.   Having a strong support network. Spend time with family, friends, or other people you enjoy being around. Express your feelings and talk things over with someone you trust. Counseling or talk therapy with a mental health professional may be helpful if you are having trouble managing stress on your own. Follow these instructions at home: Lifestyle   Avoid drugs.  Do not use any products that contain nicotine or tobacco, such as cigarettes, e-cigarettes, and chewing tobacco. If you need help quitting, ask your health care provider.  Limit alcohol intake to no more than 1 drink a day for nonpregnant women and 2 drinks a day for men. One drink equals 12 oz of beer, 5 oz of wine, or 1 oz of hard liquor  Do not use alcohol or drugs to relax.  Eat a balanced diet that includes fresh fruits and vegetables, whole grains, lean meats, fish, eggs, and beans, and low-fat dairy. Avoid processed foods and foods high in added fat, sugar, and salt.  Exercise at least 30 minutes on 5 or more days each week.  Get 7-8 hours of sleep each night. General instructions   Practice stress management techniques as discussed with your health care provider.  Drink enough fluid to keep your urine clear or pale yellow.  Take over-the-counter and prescription medicines only as told by your health care provider.  Keep all follow-up visits as told by your health care provider. This is important. Contact a health care provider if:  Your symptoms get worse.  You have new symptoms.  You feel overwhelmed by your problems and can no longer manage them on your own. Get help right away if:  You have thoughts of hurting yourself or others. If you ever feel like you may hurt yourself or others, or have thoughts about taking your own life, get help right away. You can go to your nearest emergency department or call:  Your local emergency services (911 in the [place]  A suicide crisis helpline, such as the National  Suicide Prevention Lifeline at 1-800-273-8255. This is open 24 hours a day. Summary  Stress is a normal reaction to life events. It can cause problems if it happens too often or for too long.  Practicing stress management techniques is the best way to treat stress.  Counseling or talk therapy with a mental health professional may be helpful if you are having trouble managing stress on your own. This information is not intended to replace advice given to you by your health care provider. Make sure you discuss any questions you have with your health care provider. Document Revised: 09/14/2018 Document Reviewed: 04/06/2016 Elsevier Patient Education  2020 [employer] Inc[employer]  

## 2019-08-24 ENCOUNTER — Other Ambulatory Visit: Payer: Self-pay | Admitting: Nurse Practitioner

## 2019-08-24 DIAGNOSIS — F339 Major depressive disorder, recurrent, unspecified: Secondary | ICD-10-CM

## 2019-10-24 ENCOUNTER — Other Ambulatory Visit: Payer: Self-pay | Admitting: Nurse Practitioner

## 2019-10-24 DIAGNOSIS — F339 Major depressive disorder, recurrent, unspecified: Secondary | ICD-10-CM

## 2019-12-27 ENCOUNTER — Other Ambulatory Visit: Payer: Self-pay | Admitting: Nurse Practitioner

## 2019-12-27 DIAGNOSIS — F339 Major depressive disorder, recurrent, unspecified: Secondary | ICD-10-CM

## 2020-01-13 ENCOUNTER — Other Ambulatory Visit: Payer: Self-pay | Admitting: Nurse Practitioner

## 2020-01-13 DIAGNOSIS — F419 Anxiety disorder, unspecified: Secondary | ICD-10-CM

## 2020-03-07 ENCOUNTER — Other Ambulatory Visit: Payer: Self-pay | Admitting: Nurse Practitioner

## 2020-03-07 DIAGNOSIS — F339 Major depressive disorder, recurrent, unspecified: Secondary | ICD-10-CM

## 2020-03-13 ENCOUNTER — Telehealth: Payer: Self-pay | Admitting: Nurse Practitioner

## 2020-03-13 NOTE — Telephone Encounter (Signed)
  Prescription Request  03/13/2020  What is the name of the medication or equipment? Klonopin   Have you contacted your pharmacy to request a refill? (if applicable) yes  Which pharmacy would you like this sent to? [facility] in [place]   Patient notified that their request is being sent to the clinical staff for review and that they should receive a response within 2 business days.     Appt made for 2/28

## 2020-03-13 NOTE — Telephone Encounter (Signed)
Controlled needs appt

## 2020-03-21 ENCOUNTER — Other Ambulatory Visit: Payer: Self-pay | Admitting: Nurse Practitioner

## 2020-03-21 DIAGNOSIS — F419 Anxiety disorder, unspecified: Secondary | ICD-10-CM

## 2020-04-12 ENCOUNTER — Other Ambulatory Visit: Payer: Self-pay | Admitting: Nurse Practitioner

## 2020-04-12 DIAGNOSIS — F339 Major depressive disorder, recurrent, unspecified: Secondary | ICD-10-CM

## 2020-04-27 ENCOUNTER — Ambulatory Visit: Payer: Self-pay | Admitting: Nurse Practitioner

## 2020-05-11 ENCOUNTER — Ambulatory Visit: Payer: Self-pay | Admitting: Nurse Practitioner

## 2020-06-01 ENCOUNTER — Ambulatory Visit: Payer: Self-pay | Admitting: Nurse Practitioner

## 2020-06-18 ENCOUNTER — Encounter: Payer: Self-pay | Admitting: Nurse Practitioner

## 2020-07-18 ENCOUNTER — Other Ambulatory Visit: Payer: Self-pay | Admitting: Nurse Practitioner

## 2020-07-18 DIAGNOSIS — F339 Major depressive disorder, recurrent, unspecified: Secondary | ICD-10-CM

## 2020-07-20 NOTE — Telephone Encounter (Signed)
MMM NTBS Last OV 07/08/19

## 2020-08-24 ENCOUNTER — Ambulatory Visit (INDEPENDENT_AMBULATORY_CARE_PROVIDER_SITE_OTHER): Payer: Self-pay | Admitting: Nurse Practitioner

## 2020-08-24 ENCOUNTER — Encounter: Payer: Self-pay | Admitting: Nurse Practitioner

## 2020-08-24 ENCOUNTER — Other Ambulatory Visit: Payer: Self-pay

## 2020-08-24 VITALS — BP 133/85 | HR 75 | Temp 97.7°F | Resp 20 | Ht 63.0 in | Wt 177.0 lb

## 2020-08-24 DIAGNOSIS — F419 Anxiety disorder, unspecified: Secondary | ICD-10-CM

## 2020-08-24 DIAGNOSIS — F410 Panic disorder [episodic paroxysmal anxiety] without agoraphobia: Secondary | ICD-10-CM

## 2020-08-24 DIAGNOSIS — F339 Major depressive disorder, recurrent, unspecified: Secondary | ICD-10-CM

## 2020-08-24 MED ORDER — CLONAZEPAM 0.5 MG PO TABS
0.5000 mg | ORAL_TABLET | Freq: Two times a day (BID) | ORAL | 5 refills | Status: DC | PRN
Start: 2020-08-24 — End: 2021-03-05

## 2020-08-24 MED ORDER — CLONAZEPAM 0.5 MG PO TABS
0.5000 mg | ORAL_TABLET | Freq: Two times a day (BID) | ORAL | 5 refills | Status: DC | PRN
Start: 2020-08-24 — End: 2020-08-24

## 2020-08-24 MED ORDER — ESCITALOPRAM OXALATE 20 MG PO TABS
20.0000 mg | ORAL_TABLET | Freq: Every day | ORAL | 0 refills | Status: DC
Start: 2020-08-24 — End: 2020-11-19

## 2020-08-24 NOTE — Progress Notes (Signed)
Subjective:    Patient ID: Crystal Mata, female    DOB: 1960-08-27, 60 y.o.   MRN: [id number]   Chief Complaint: Medical Management of Chronic Issues    HPI:  1. Anxiety Is on klonopin daily. Is doing well. Has some anxiety GAD 7 : Generalized Anxiety Score 08/24/2020 07/08/2019 09/03/2018 09/23/2016  Nervous, Anxious, on Edge 3 3 1 2   Control/stop worrying 1 1 1 3   Worry too much - different things 1 1 2 3   Trouble relaxing 1 3 1 2   Restless 0 0 0 3  Easily annoyed or irritable 0 0 2 3  Afraid - awful might happen 1 1 1 2   Total GAD 7 Score 7 9 8 18   Anxiety Difficulty Not difficult at all Not difficult at all Somewhat difficult Very difficult      2. Depression, recurrent (HCC) Is on lexapro 20mg  and is doing well. Her son overdosed last year and she is having a hard time. Depression screen [facility] 2/9 08/24/2020 07/08/2019 09/03/2018  Decreased Interest 0 0 2  Down, Depressed, Hopeless 0 0 2  PHQ - 2 Score 0 0 4  Altered sleeping 3 - 2  Tired, decreased energy 3 - 3  Change in appetite 2 - 2  Feeling bad or failure about yourself  2 - 2  Trouble concentrating 1 - 2  Moving slowly or fidgety/restless 0 - 2  Suicidal thoughts 0 - 0  PHQ-9 Score 11 - 17  Difficult doing work/chores Not difficult at all - -     3. Panic attack Has not had any recent panic attacks    Outpatient Encounter Medications as of 08/24/2020  Medication Sig   clonazePAM (KLONOPIN) 0.5 MG tablet Take 1 tablet (0.5 mg total) by mouth 2 (two) times daily as needed for anxiety.   escitalopram (LEXAPRO) 20 MG tablet Take 1 tablet (20 mg total) by mouth daily.   No facility-administered encounter medications on file as of 08/24/2020.    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  Controlled substance contract: 08/24/20     Review of Systems  Constitutional:  Negative for diaphoresis.  Eyes:  Negative for pain.  Respiratory:  Negative for shortness of breath.   Cardiovascular:  Negative for chest pain, palpitations and leg swelling.  Gastrointestinal:  Negative for abdominal pain.  Endocrine: Negative for polydipsia.  Skin:  Negative for rash.  Neurological:  Negative for dizziness, weakness and headaches.  Hematological:  Does not bruise/bleed easily.  All other systems reviewed and are negative.     Objective:   Physical Exam Vitals and nursing note reviewed.  Constitutional:      General: She is not in acute distress.    Appearance: Normal appearance. She is well-developed.  HENT:     Head: Normocephalic.     Right Ear: Tympanic membrane normal.     Left Ear: Tympanic membrane normal.     Nose: Nose normal.     Mouth/Throat:     Mouth: Mucous membranes are moist.  Eyes:     Pupils: Pupils are equal,  round, and reactive to light.  Neck:     Vascular: No carotid bruit or JVD.  Cardiovascular:     Rate and Rhythm: Normal rate and regular rhythm.     Heart sounds: Normal heart sounds.  Pulmonary:     Effort: Pulmonary effort is normal. No respiratory distress.     Breath sounds: Normal breath sounds. No wheezing or rales.  Chest:     Chest wall: No tenderness.  Abdominal:     General: Bowel sounds are normal. There is no distension or abdominal bruit.     Palpations: Abdomen is soft. There is no hepatomegaly, splenomegaly, mass or pulsatile mass.     Tenderness: There is no abdominal tenderness.  Musculoskeletal:        General: Normal range of motion.     Cervical back: Normal range of motion and neck supple.  Lymphadenopathy:     Cervical: No cervical adenopathy.  Skin:    General: Skin is warm and dry.  Neurological:     Mental  Status: She is alert and oriented to person, place, and time.     Deep Tendon Reflexes: Reflexes are normal and symmetric.  Psychiatric:        Behavior: Behavior normal.        Thought Content: Thought content normal.        Judgment: Judgment normal.    BP 133/85   Pulse 75   Temp 97.7 F (36.5 C) (Temporal)   Resp 20   Ht 5\' 3"  (1.6 m)   Wt 177 lb (80.3 kg)   SpO2 98%   BMI 31.35 kg/m        Assessment & Plan:  in today with chief complaint of Medical Management of Chronic Issues   1. Anxiety  2. Depression, recurrent (HCC)  3. Panic attack Stress management Exercise Meds ordered this encounter  Medications   clonazePAM (KLONOPIN) 0.5 MG tablet    Sig: Take 1 tablet (0.5 mg total) by mouth 2 (two) times daily as needed for anxiety.    Dispense:  30 tablet    Refill:  5    Order Specific Question:   Supervising Provider    Answer:   Benard Rink A [[id number]]   escitalopram (LEXAPRO) 20 MG tablet    Sig: Take 1 tablet (20 mg total) by mouth daily.    Dispense:  90 tablet    Refill:  0    Order Specific Question:   Supervising Provider    Answer:   Arville Care A [[id number]]        The above assessment and management plan was discussed with the patient. The patient verbalized understanding of and has agreed to the management plan. Patient is aware to call the clinic if symptoms persist or worsen. Patient is aware when to return to the clinic for a follow-up visit. Patient educated on when it is appropriate to go to the emergency department.   Mary-Margaret Arville Care, FNP

## 2020-08-24 NOTE — Addendum Note (Signed)
Addended by: Bennie Pierini on: 08/24/2020 12:34 PM   Modules accepted: Orders

## 2020-08-24 NOTE — Patient Instructions (Signed)
Textbook of family medicine (9th ed., pp. 1062-1073). [place], [place]: [employer].">  Stress, Adult Stress is a normal reaction to life events. Stress is what you feel when life demands more than you are used to, or more than you think you can handle. Some stress can be useful, such as studying for a test or meeting a deadline at work. Stress that occurs too often or for too long can cause problems. It can affect your emotional health and interfere with relationships and normal daily activities. Too much stress can weaken your body's defense system (immune system) and increase your risk for physical illness. If you already have a medicalproblem, stress can make it worse. What are the causes? All sorts of life events can cause stress. An event that causes stress for one person may not be stressful for another person. Major life events, whether positive or negative, commonly cause stress. Examples include: Losing a job or starting a new job. Losing a loved one. Moving to a new town or home. Getting married or divorced. Having a baby. Getting injured or sick. Less obvious life events can also cause stress, especially if they occur day after day or in combination with each other. Examples include: Working long hours. Driving in traffic. Caring for children. Being in debt. Being in a difficult relationship. What are the signs or symptoms? Stress can cause emotional symptoms, including: Anxiety. This is feeling worried, afraid, on edge, overwhelmed, or out of control. Anger, including irritation or impatience. Depression. This is feeling sad, down, helpless, or guilty. Trouble focusing, remembering, or making decisions. Stress can cause physical symptoms, including: Aches and pains. These may affect your head, neck, back, stomach, or other areas of your body. Tight muscles or a clenched jaw. Low energy. Trouble sleeping. Stress can cause unhealthy behaviors, including: Eating to feel better  (overeating) or skipping meals. Working too much or putting off tasks. Smoking, drinking alcohol, or using drugs to feel better. How is this diagnosed? Stress is diagnosed through an assessment by your health care provider. He or she may diagnose this condition based on: Your symptoms and any stressful life events. Your medical history. Tests to rule out other causes of your symptoms. Depending on your condition, your health care provider may refer you to aspecialist for further evaluation. How is this treated?  Stress management techniques are the recommended treatment for stress. Medicineis not typically recommended for the treatment of stress. Techniques to reduce your reaction to stressful life events include: Stress identification. Monitor yourself for symptoms of stress and identify what causes stress for you. These skills may help you to avoid or prepare for stressful events. Time management. Set your priorities, keep a calendar of events, and learn to say no. Taking these actions can help you avoid making too many commitments. Techniques for coping with stress include: Rethinking the problem. Try to think realistically about stressful events rather than ignoring them or overreacting. Try to find the positives in a stressful situation rather than focusing on the negatives. Exercise. Physical exercise can release both physical and emotional tension. The key is to find a form of exercise that you enjoy and do it regularly. Relaxation techniques. These relax the body and mind. The key is to find one or more that you enjoy and use the techniques regularly. Examples include: Meditation, deep breathing, or progressive relaxation techniques. Yoga or tai chi. Biofeedback, mindfulness techniques, or journaling. Listening to music, being out in nature, or participating in other hobbies. Practicing a healthy lifestyle.   Eat a balanced diet, drink plenty of water, limit or avoid caffeine, and get  plenty of sleep. Having a strong support network. Spend time with family, friends, or other people you enjoy being around. Express your feelings and talk things over with someone you trust. Counseling or talk therapy with a mental health professional may be helpful if you are havingtrouble managing stress on your own. Follow these instructions at home: Lifestyle  Avoid drugs. Do not use any products that contain nicotine or tobacco, such as cigarettes, e-cigarettes, and chewing tobacco. If you need help quitting, ask your health care provider. Limit alcohol intake to no more than 1 drink a day for nonpregnant women and 2 drinks a day for men. One drink equals 12 oz of beer, 5 oz of wine, or 1 oz of hard liquor Do not use alcohol or drugs to relax. Eat a balanced diet that includes fresh fruits and vegetables, whole grains, lean meats, fish, eggs, and beans, and low-fat dairy. Avoid processed foods and foods high in added fat, sugar, and salt. Exercise at least 30 minutes on 5 or more days each week. Get 7-8 hours of sleep each night.  General instructions  Practice stress management techniques as discussed with your health care provider. Drink enough fluid to keep your urine clear or pale yellow. Take over-the-counter and prescription medicines only as told by your health care provider. Keep all follow-up visits as told by your health care provider. This is important.  Contact a health care provider if: Your symptoms get worse. You have new symptoms. You feel overwhelmed by your problems and can no longer manage them on your own. Get help right away if: You have thoughts of hurting yourself or others. If you ever feel like you may hurt yourself or others, or have thoughts about taking your own life, get help right away. You can go to your nearest emergency department or call: Your local emergency services (911 in the [place] A suicide crisis helpline, such as the [facility] at 4253100524. This is open 24 hours a day. Summary Stress is a normal reaction to life events. It can cause problems if it happens too often or for too long. Practicing stress management techniques is the best way to treat stress. Counseling or talk therapy with a mental health professional may be helpful if you are having trouble managing stress on your own. This information is not intended to replace advice given to you by your health care provider. Make sure you discuss any questions you have with your healthcare provider. Document Revised: 11/01/2019 Document Reviewed: 11/01/2019 Elsevier Patient Education  2022 [employer].

## 2020-11-18 ENCOUNTER — Other Ambulatory Visit: Payer: Self-pay | Admitting: Nurse Practitioner

## 2020-11-18 DIAGNOSIS — F339 Major depressive disorder, recurrent, unspecified: Secondary | ICD-10-CM

## 2021-02-15 ENCOUNTER — Encounter: Payer: Self-pay | Admitting: Nurse Practitioner

## 2021-02-15 ENCOUNTER — Ambulatory Visit (INDEPENDENT_AMBULATORY_CARE_PROVIDER_SITE_OTHER): Payer: Self-pay | Admitting: Nurse Practitioner

## 2021-02-15 DIAGNOSIS — F339 Major depressive disorder, recurrent, unspecified: Secondary | ICD-10-CM

## 2021-02-15 DIAGNOSIS — Z91199 Patient's noncompliance with other medical treatment and regimen due to unspecified reason: Secondary | ICD-10-CM

## 2021-02-15 DIAGNOSIS — F419 Anxiety disorder, unspecified: Secondary | ICD-10-CM

## 2021-02-15 DIAGNOSIS — F410 Panic disorder [episodic paroxysmal anxiety] without agoraphobia: Secondary | ICD-10-CM

## 2021-02-15 NOTE — Progress Notes (Signed)
Attempted to call patient 3x with no answer. Could not leave voice mail, mail box was full.  Mary-Margaret Daphine Deutscher, FNP

## 2021-03-05 ENCOUNTER — Ambulatory Visit (INDEPENDENT_AMBULATORY_CARE_PROVIDER_SITE_OTHER): Payer: Self-pay | Admitting: Nurse Practitioner

## 2021-03-05 ENCOUNTER — Encounter: Payer: Self-pay | Admitting: Nurse Practitioner

## 2021-03-05 DIAGNOSIS — F339 Major depressive disorder, recurrent, unspecified: Secondary | ICD-10-CM

## 2021-03-05 DIAGNOSIS — F419 Anxiety disorder, unspecified: Secondary | ICD-10-CM

## 2021-03-05 MED ORDER — CLONAZEPAM 0.5 MG PO TABS: 0.5000 mg | ORAL_TABLET | Freq: Two times a day (BID) | ORAL | 5 refills | Status: DC | PRN

## 2021-03-05 MED ORDER — ESCITALOPRAM OXALATE 20 MG PO TABS: 20.0000 mg | ORAL_TABLET | Freq: Every day | ORAL | 1 refills | Status: DC

## 2021-03-05 NOTE — Progress Notes (Signed)
Virtual Visit  Note Due to COVID-19 pandemic this visit was conducted virtually. This visit type was conducted due to national recommendations for restrictions regarding the COVID-19 Pandemic (e.g. social distancing, sheltering in place) in an effort to limit this patient's exposure and mitigate transmission in our community. All issues noted in this document were discussed and addressed.  A physical exam was not performed with this format.  I connected with Benard Rink on 03/05/21 at 4:02 by telephone and verified that I am speaking with the correct person using two identifiers. Crystal Mata is currently located at work  and no one is currently with  her during visit. The provider, Mary-Margaret Daphine Deutscher, FNP is located in their office at time of visit.  I discussed the limitations, risks, security and privacy concerns of performing an evaluation and management service by telephone and the availability of in person appointments. I also discussed with the patient that there may be a patient responsible charge related to this service. The patient expressed understanding and agreed to proceed.   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 SHE VERY RARELY TAKES HER KLONOPIN. DENIES ANY MEDICATION SIDE EFFECTS.   Depression screen [facility] 2/9 03/05/2021 08/24/2020 07/08/2019  Decreased Interest 1 0 0  Down, Depressed, Hopeless 0 0 0  PHQ - 2 Score 1 0 0  Altered sleeping 1 3 -  Tired, decreased energy 1 3 -  Change in appetite 0 2 -  Feeling bad or failure about yourself  0 2 -  Trouble concentrating 0 1 -  Moving slowly or fidgety/restless 0 0 -  Suicidal thoughts 0 0 -  PHQ-9 Score 3 11 -  Difficult doing work/chores Somewhat difficult Not difficult at all -   GAD 7 : Generalized Anxiety Score 03/05/2021 08/24/2020 07/08/2019 09/03/2018  Nervous, Anxious, on Edge 1 3 3 1   Control/stop worrying 1 1 1 1   Worry too much - different things 1  1 1 2   Trouble relaxing 0 1 3 1   Restless 0 0 0 0  Easily annoyed or irritable 1 0 0 2  Afraid - awful might happen 0 1 1 1   Total GAD 7 Score 4 7 9 8   Anxiety Difficulty Somewhat difficult Not difficult at all Not difficult at all Somewhat difficult        Review of Systems  Constitutional:  Negative for diaphoresis and weight loss.  Eyes:  Negative for blurred vision, double vision and pain.  Respiratory:  Negative for shortness of breath.   Cardiovascular:  Negative for chest pain, palpitations, orthopnea and leg swelling.  Gastrointestinal:  Negative for abdominal pain.  Skin:  Negative for rash.  Neurological:  Negative for dizziness, sensory change, loss of consciousness, weakness and headaches.  Endo/Heme/Allergies:  Negative for polydipsia. Does not bruise/bleed easily.  Psychiatric/Behavioral:  Negative for memory loss. The patient does not have insomnia.   All other systems reviewed and are negative.   Observations/Objective: Alert and oriented- answers all questions appropriately No distress   Assessment and Plan: Dalylah Ramey in today with chief complaint of No chief complaint on file.   1. Anxiety - clonazePAM (KLONOPIN) 0.5 MG tablet; Take 1 tablet (0.5 mg total) by mouth 2 (two) times daily as needed for anxiety.  Dispense: 30 tablet; Refill: 5  2. Depression, recurrent (HCC) Stress management reviewed - escitalopram (LEXAPRO) 20 MG tablet; Take 1 tablet (20 mg total) by mouth daily.  Dispense: 90 tablet; Refill:  1    Follow Up Instructions: 6 months    I discussed the assessment and treatment plan with the patient. The patient was provided an opportunity to ask questions and all were answered. The patient agreed with the plan and demonstrated an understanding of the instructions.   The patient was advised to call back or seek an in-person evaluation if the symptoms worsen or if the condition fails to improve as anticipated.  The above assessment  and management plan was discussed with the patient. The patient verbalized understanding of and has agreed to the management plan. Patient is aware to call the clinic if symptoms persist or worsen. Patient is aware when to return to the clinic for a follow-up visit. Patient educated on when it is appropriate to go to the emergency department.   Time call ended:  4:14  I provided 12 minutes of  non face-to-face time during this encounter.    Mary-Margaret Daphine Deutscher, FNP

## 2021-12-26 ENCOUNTER — Other Ambulatory Visit: Payer: Self-pay | Admitting: Nurse Practitioner

## 2021-12-26 DIAGNOSIS — F339 Major depressive disorder, recurrent, unspecified: Secondary | ICD-10-CM

## 2022-01-10 ENCOUNTER — Other Ambulatory Visit: Payer: Self-pay | Admitting: Nurse Practitioner

## 2022-01-10 DIAGNOSIS — F339 Major depressive disorder, recurrent, unspecified: Secondary | ICD-10-CM

## 2022-01-26 ENCOUNTER — Other Ambulatory Visit: Payer: Self-pay | Admitting: Nurse Practitioner

## 2022-01-26 DIAGNOSIS — F419 Anxiety disorder, unspecified: Secondary | ICD-10-CM

## 2022-02-22 ENCOUNTER — Other Ambulatory Visit: Payer: Self-pay | Admitting: Nurse Practitioner

## 2022-02-22 DIAGNOSIS — F339 Major depressive disorder, recurrent, unspecified: Secondary | ICD-10-CM

## 2022-02-22 NOTE — Telephone Encounter (Signed)
Refills refused-Please schedule pt an appt with PCP for further refills.

## 2022-02-22 NOTE — Telephone Encounter (Signed)
MADE APPT JAN 8. PT WILL BE OUT OF MEDS BEFORE THEN

## 2022-02-26 ENCOUNTER — Other Ambulatory Visit: Payer: Self-pay | Admitting: Nurse Practitioner

## 2022-02-26 DIAGNOSIS — F339 Major depressive disorder, recurrent, unspecified: Secondary | ICD-10-CM

## 2022-03-07 ENCOUNTER — Ambulatory Visit: Payer: Self-pay | Admitting: Nurse Practitioner

## 2022-03-29 ENCOUNTER — Other Ambulatory Visit: Payer: Self-pay | Admitting: Nurse Practitioner

## 2022-03-29 DIAGNOSIS — F339 Major depressive disorder, recurrent, unspecified: Secondary | ICD-10-CM

## 2022-04-02 ENCOUNTER — Other Ambulatory Visit: Payer: Self-pay | Admitting: Nurse Practitioner

## 2022-04-02 DIAGNOSIS — F339 Major depressive disorder, recurrent, unspecified: Secondary | ICD-10-CM

## 2022-04-04 ENCOUNTER — Encounter: Payer: Self-pay | Admitting: Nurse Practitioner

## 2022-04-04 ENCOUNTER — Ambulatory Visit (INDEPENDENT_AMBULATORY_CARE_PROVIDER_SITE_OTHER): Payer: Self-pay | Admitting: Nurse Practitioner

## 2022-04-04 VITALS — BP 142/87 | HR 68 | Temp 96.9°F | Resp 20 | Ht 63.0 in | Wt 178.0 lb

## 2022-04-04 DIAGNOSIS — F410 Panic disorder [episodic paroxysmal anxiety] without agoraphobia: Secondary | ICD-10-CM

## 2022-04-04 DIAGNOSIS — F419 Anxiety disorder, unspecified: Secondary | ICD-10-CM

## 2022-04-04 DIAGNOSIS — F339 Major depressive disorder, recurrent, unspecified: Secondary | ICD-10-CM

## 2022-04-04 MED ORDER — DIAZEPAM 5 MG PO TABS: 5.0000 mg | ORAL_TABLET | Freq: Two times a day (BID) | ORAL | 1 refills | Status: DC | PRN

## 2022-04-04 MED ORDER — ESCITALOPRAM OXALATE 20 MG PO TABS: 20.0000 mg | ORAL_TABLET | Freq: Every day | ORAL | 5 refills | Status: DC

## 2022-04-04 NOTE — Progress Notes (Signed)
Subjective:    Patient ID: Crystal Mata, female    DOB: [birth year], 62 y.o.   MRN: [id number]   Chief Complaint: Medical Management of Chronic Issues    HPI:  Crystal Mata is a 62 y.o. who identifies as a female who was assigned female at birth.   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 Anxiety 2. Depression, recurrent ([place]) 3. Panic attack Patient is on lexapro and klonopin combination daily. She is doing well. She says the klonopin is to strong and makes her feel drowsy. She says she cannot function on klonopin. She has used valium in the past and that helped better. Doe sot want to do xanax.     04/04/2022   11:58 AM 03/05/2021    4:06 PM 08/24/2020   12:00 PM 07/08/2019    4:22 PM  GAD 7 : Generalized Anxiety Score  Nervous, Anxious, on Edge 1 1 3 3   Control/stop worrying 2 1 1 1   Worry too much - different things 2 1 1 1   Trouble relaxing 2 0 1 3  Restless 1 0 0 0  Easily annoyed or irritable 1 1 0 0  Afraid - awful might happen 1 0 1 1  Total GAD 7 Score 10 4 7 9   Anxiety Difficulty Somewhat difficult Somewhat difficult Not difficult at all Not difficult at all       04/04/2022   11:58 AM 03/05/2021    4:04 PM 08/24/2020   12:00 PM  Depression screen PHQ 2/9  Decreased Interest 3 1 0  Down, Depressed, Hopeless 1 0 0  PHQ - 2 Score 4 1 0  Altered sleeping 3 1 3   Tired, decreased energy 3 1 3   Change in appetite 3 0 2  Feeling bad or failure about yourself  2 0 2  Trouble concentrating 1 0 1  Moving slowly or fidgety/restless 1 0 0  Suicidal thoughts 0 0 0  PHQ-9 Score 17 3 11   Difficult doing work/chores Somewhat difficult Somewhat difficult Not difficult at all      New complaints: none  No Known Allergies Outpatient Encounter Medications as of 04/04/2022  Medication Sig   escitalopram (LEXAPRO) 20 MG tablet TAKE 1 TABLET  (20 MG TOTAL) BY MOUTH DAILY. (NEEDS TO BE SEEN BEFORE NEXT REFILL)   clonazePAM (KLONOPIN) 0.5 MG tablet Take 1 tablet (0.5 mg total) by mouth 2 (two) times daily as needed for anxiety. (Patient not taking: Reported on 04/04/2022)   No facility-administered encounter medications on file as of 04/04/2022.    Past Surgical History:  Procedure Laterality Date   TONSILLECTOMY      Family History  Problem Relation Age of Onset   Hypertension Mother    Breast cancer Mother        Partial mastecomy   Heart attack Mother    Diabetic kidney disease Father       Controlled substance contract: 04/04/22 today     Review of Systems  Constitutional:  Negative for diaphoresis.  Eyes:  Negative for pain.  Respiratory:  Negative for shortness of breath.   Cardiovascular:  Negative for chest pain, palpitations and leg swelling.  Gastrointestinal:  Negative for abdominal pain.  Endocrine: Negative for polydipsia.  Skin:  Negative for rash.  Neurological:  Negative for dizziness, weakness and headaches.  Hematological:  Does not bruise/bleed easily.  All other systems reviewed and are negative.      Objective:   Physical Exam Vitals and nursing note reviewed.  Constitutional:      General: She is not in acute distress.    Appearance: Normal appearance. She is well-developed.  Neck:     Vascular: No carotid bruit or JVD.  Cardiovascular:     Rate and Rhythm: Normal rate and regular rhythm.     Heart sounds: Normal heart sounds.  Pulmonary:     Effort: Pulmonary effort is normal. No respiratory distress.     Breath sounds: Normal breath sounds. No wheezing or rales.  Chest:     Chest wall: No tenderness.  Abdominal:     General: Bowel sounds are normal. There is no distension or abdominal bruit.     Palpations: Abdomen is soft. There is no hepatomegaly, splenomegaly, mass or pulsatile mass.     Tenderness: There is no abdominal tenderness.  Musculoskeletal:        General: Normal  range of motion.     Cervical back: Normal range of motion and neck supple.  Lymphadenopathy:     Cervical: No cervical adenopathy.  Skin:    General: Skin is warm and dry.  Neurological:     Mental Status: She is alert and oriented to person, place, and time.     Deep Tendon Reflexes: Reflexes are normal and symmetric.  Psychiatric:        Behavior: Behavior normal.        Thought Content: Thought content normal.        Judgment: Judgment normal.    BP (!) 142/87   Pulse 68   Temp (!) 96.9 F (36.1 C) (Temporal)   Resp 20   Ht 5\' 3"  (1.6 m)   Wt 178 lb (80.7 kg)   SpO2 99%   BMI 31.53 kg/m        Assessment & Plan:   [name] in today with chief complaint of Medical Management of Chronic Issues   1. Anxiety Stress managemment Stop klonopin Try valium- try breaking I half to start with - diazepam (VALIUM) 5 MG tablet; Take 1 tablet (5 mg total) by mouth every 12 (twelve) hours as needed for anxiety.  Dispense: 60 tablet; Refill: 1  2. Depression, recurrent ([place]) - escitalopram (LEXAPRO) 20 MG tablet; Take 1 tablet (20 mg total) by mouth daily. (NEEDS TO BE SEEN BEFORE NEXT REFILL)  Dispense: 30 tablet; Refill: 5  3. Panic attack    The above assessment and management plan was discussed with the patient. The patient verbalized understanding of and has agreed to the management plan. Patient is aware to call the clinic if symptoms persist or worsen. Patient is aware when to return to the clinic for a follow-up visit. Patient educated on when it is appropriate to go to the emergency department.   [name] Done, FNP

## 2022-04-07 ENCOUNTER — Encounter: Payer: Self-pay | Admitting: Nurse Practitioner

## 2022-06-03 ENCOUNTER — Other Ambulatory Visit: Payer: Self-pay | Admitting: Nurse Practitioner

## 2022-06-03 DIAGNOSIS — F339 Major depressive disorder, recurrent, unspecified: Secondary | ICD-10-CM

## 2022-08-14 ENCOUNTER — Other Ambulatory Visit: Payer: Self-pay | Admitting: Nurse Practitioner

## 2022-08-14 DIAGNOSIS — F339 Major depressive disorder, recurrent, unspecified: Secondary | ICD-10-CM

## 2022-10-03 ENCOUNTER — Ambulatory Visit: Payer: Self-pay | Admitting: Nurse Practitioner

## 2022-12-08 ENCOUNTER — Encounter: Payer: Self-pay | Admitting: Nurse Practitioner

## 2022-12-09 ENCOUNTER — Telehealth: Payer: Self-pay | Admitting: Nurse Practitioner

## 2022-12-09 DIAGNOSIS — F339 Major depressive disorder, recurrent, unspecified: Secondary | ICD-10-CM

## 2022-12-09 MED ORDER — ESCITALOPRAM OXALATE 20 MG PO TABS: 20.0000 mg | ORAL_TABLET | Freq: Every day | ORAL | 0 refills | Status: DC

## 2022-12-09 NOTE — Telephone Encounter (Signed)
Refill sent to pharmacy. Attempted to notify patient but voicemail was full

## 2022-12-09 NOTE — Telephone Encounter (Signed)
  Prescription Request  12/09/2022  Is this a "Controlled Substance" medicine? escitalopram (LEXAPRO) 20 MG tablet   Have you seen your PCP in the last 2 weeks? Pt has an apt on 01/09/2023 on a Monday because Mondays is her day off. Can pt ger refill until her apt?  If YES, route message to pool  -  If NO, patient needs to be scheduled for appointment.  What is the name of the medication or equipment? escitalopram (LEXAPRO) 20 MG tablet   Have you contacted your pharmacy to request a refill? no   Which pharmacy would you like this sent to? [facility]   Patient notified that their request is being sent to the clinical staff for review and that they should receive a response within 2 business days.

## 2023-01-09 ENCOUNTER — Ambulatory Visit: Payer: Self-pay | Admitting: Nurse Practitioner

## 2023-01-09 NOTE — Progress Notes (Deleted)
   Subjective:    Patient ID: Crystal Mata, female    DOB: 1960-11-15, 62 y.o.   MRN: [id number]   Chief Complaint: No chief complaint on file.    HPI:  Crystal Mata is a 62 y.o. who identifies as a female who was assigned female at birth.   Social history: Lives with: *** Work history: ***   Comes in today for follow up of the following chronic medical issues:  1. Anxiety Is on klonopin BID- cannot go with out medication due to anxiety, ***  2. Panic attack Not as long as she is on her meds.  3. Depression, recurrent ([facility]) Has been on lexapro for several years which helps ***   New complaints: ***  No Known Allergies Outpatient Encounter Medications as of 01/09/2023  Medication Sig   clonazePAM (KLONOPIN) 0.5 MG tablet Take 1 tablet (0.5 mg total) by mouth 2 (two) times daily as needed for anxiety. (Patient not taking: Reported on 04/04/2022)   diazepam (VALIUM) 5 MG tablet Take 1 tablet (5 mg total) by mouth every 12 (twelve) hours as needed for anxiety.   escitalopram (LEXAPRO) 20 MG tablet Take 1 tablet (20 mg total) by mouth daily.   No facility-administered encounter medications on file as of 01/09/2023.    Past Surgical History:  Procedure Laterality Date   TONSILLECTOMY      Family History  Problem Relation Age of Onset   Hypertension Mother    Breast cancer Mother        Partial mastecomy   Heart attack Mother    Diabetic kidney disease Father       Controlled substance contract: ***      Review of Systems     Objective:   Physical Exam        Assessment & Plan:

## 2023-01-11 ENCOUNTER — Encounter: Payer: Self-pay | Admitting: Nurse Practitioner

## 2023-01-23 ENCOUNTER — Encounter: Payer: Self-pay | Admitting: Nurse Practitioner

## 2023-01-23 ENCOUNTER — Ambulatory Visit: Payer: Self-pay | Admitting: Nurse Practitioner

## 2023-01-23 VITALS — BP 129/78 | HR 78 | Temp 97.7°F | Resp 20 | Ht 63.0 in | Wt 173.0 lb

## 2023-01-23 DIAGNOSIS — F419 Anxiety disorder, unspecified: Secondary | ICD-10-CM

## 2023-01-23 DIAGNOSIS — F410 Panic disorder [episodic paroxysmal anxiety] without agoraphobia: Secondary | ICD-10-CM

## 2023-01-23 DIAGNOSIS — M2550 Pain in unspecified joint: Secondary | ICD-10-CM

## 2023-01-23 DIAGNOSIS — F339 Major depressive disorder, recurrent, unspecified: Secondary | ICD-10-CM

## 2023-01-23 MED ORDER — ESCITALOPRAM OXALATE 20 MG PO TABS: 20.0000 mg | ORAL_TABLET | Freq: Every day | ORAL | 0 refills | Status: DC

## 2023-01-23 MED ORDER — DIAZEPAM 5 MG PO TABS: 5.0000 mg | ORAL_TABLET | Freq: Two times a day (BID) | ORAL | 1 refills | Status: DC | PRN

## 2023-01-23 MED ORDER — DICLOFENAC SODIUM 75 MG PO TBEC: 75.0000 mg | DELAYED_RELEASE_TABLET | Freq: Two times a day (BID) | ORAL | 0 refills | Status: DC

## 2023-01-23 NOTE — Progress Notes (Signed)
Subjective:    Patient ID: Crystal Mata, female    DOB: 1960/12/01, 62 y.o.   MRN: [id number]   Chief Complaint: medical management of chronic issues     HPI:  Crystal Mata is a 62 y.o. who identifies as a female who was assigned female at birth.   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 Anxiety  2. Depression, recurrent ([facility])  3. Panic attack Patient has pong history of panic attacks, depression and anxiety. She is currently on klonopin and lexapro. She still occasionally has panic attacks. She actually has stopped taking klonopin.    01/23/2023    9:41 AM 04/04/2022   11:58 AM 03/05/2021    4:06 PM 08/24/2020   12:00 PM  GAD 7 : Generalized Anxiety Score  Nervous, Anxious, on Edge 2 1 1 3   Control/stop worrying 1 2 1 1   Worry too much - different things 1 2 1 1   Trouble relaxing 1 2 0 1  Restless 0 1 0 0  Easily annoyed or irritable 1 1 1  0  Afraid - awful might happen 1 1 0 1  Total GAD 7 Score 7 10 4 7   Anxiety Difficulty Somewhat difficult Somewhat difficult Somewhat difficult Not difficult at all       01/23/2023    9:41 AM 04/04/2022   11:58 AM 03/05/2021    4:04 PM  Depression screen PHQ 2/9  Decreased Interest 1 3 1   Down, Depressed, Hopeless 1 1 0  PHQ - 2 Score 2 4 1   Altered sleeping 2 3 1   Tired, decreased energy 2 3 1   Change in appetite 2 3 0  Feeling bad or failure about yourself  1 2 0  Trouble concentrating 0 1 0  Moving slowly or fidgety/restless 0 1 0  Suicidal thoughts 0 0 0  PHQ-9 Score 9 17 3   Difficult doing work/chores Somewhat difficult Somewhat difficult Somewhat difficult      New complaints: Pain in all of her joints. She has to wear a wrist brace to use her hands. She has been dx with arthritis in the past. Rates pain 10/10 in wrists, other joints vary from 5-10/10.   No Known  Allergies Outpatient Encounter Medications as of 01/23/2023  Medication Sig   clonazePAM (KLONOPIN) 0.5 MG tablet Take 1 tablet (0.5 mg total) by mouth 2 (two) times daily as needed for anxiety. (Patient not taking: Reported on 04/04/2022)   diazepam (VALIUM) 5 MG tablet Take 1 tablet (5 mg total) by mouth every 12 (twelve) hours as needed for anxiety.   escitalopram (LEXAPRO) 20 MG tablet Take 1 tablet (20 mg total) by mouth daily.   No facility-administered encounter medications on file as of 01/23/2023.    Past Surgical History:  Procedure Laterality Date   TONSILLECTOMY      Family History  Problem Relation Age of Onset   Hypertension Mother    Breast cancer Mother        Partial mastecomy   Heart attack Mother    Diabetic kidney disease Father       Controlled substance contract: n/a     Review of Systems  Constitutional:  Negative for diaphoresis.  Eyes:  Negative for pain.  Respiratory:  Negative for shortness of breath.   Cardiovascular:  Negative for chest pain, palpitations and leg swelling.  Gastrointestinal:  Negative for abdominal pain.  Endocrine: Negative for polydipsia.  Skin:  Negative for rash.  Neurological:  Negative for dizziness, weakness and headaches.  Hematological:  Does not bruise/bleed easily.  All other systems reviewed and are negative.      Objective:   Physical Exam Constitutional:      Appearance: Normal appearance.  Cardiovascular:     Rate and Rhythm: Normal rate and regular rhythm.     Heart sounds: Normal heart sounds.  Pulmonary:     Effort: Pulmonary effort is normal.     Breath sounds: Normal breath sounds.  Skin:    General: Skin is warm.  Neurological:     General: No focal deficit present.     Mental Status: She is alert and oriented to person, place, and time.  Psychiatric:        Mood and Affect: Mood normal.        Behavior: Behavior normal.    BP 129/78   Pulse 78   Temp 97.7 F (36.5 C) (Temporal)    Resp 20   Ht 5\' 3"  (1.6 m)   Wt 173 lb (78.5 kg)   SpO2 96%   BMI 30.65 kg/m         Assessment & Plan:   Benard Rink in today with chief complaint of Medical Management of Chronic Issues   1. Anxiety  2. Depression, recurrent (HCC)  3. Panic attack Continue lexapro Stress management  4. Arthralgia, unspecified joint Moist heat to  wrist braces as needed RTO prn - diclofenac (VOLTAREN) 75 MG EC tablet; Take 1 tablet (75 mg total) by mouth 2 (two) times daily.  Dispense: 30 tablet; Refill: 0    The above assessment and management plan was discussed with the patient. The patient verbalized understanding of and has agreed to the management plan. Patient is aware to call the clinic if symptoms persist or worsen. Patient is aware when to return to the clinic for a follow-up visit. Patient educated on when it is appropriate to go to the emergency department.   Mary-Margaret Daphine Deutscher, FNP

## 2023-02-08 ENCOUNTER — Other Ambulatory Visit: Payer: Self-pay | Admitting: Nurse Practitioner

## 2023-02-08 DIAGNOSIS — M2550 Pain in unspecified joint: Secondary | ICD-10-CM

## 2023-05-26 ENCOUNTER — Other Ambulatory Visit: Payer: Self-pay | Admitting: Nurse Practitioner

## 2023-05-26 DIAGNOSIS — M2550 Pain in unspecified joint: Secondary | ICD-10-CM

## 2023-06-15 ENCOUNTER — Other Ambulatory Visit: Payer: Self-pay | Admitting: Nurse Practitioner

## 2023-06-15 ENCOUNTER — Encounter: Payer: Self-pay | Admitting: Nurse Practitioner

## 2023-06-15 DIAGNOSIS — F339 Major depressive disorder, recurrent, unspecified: Secondary | ICD-10-CM

## 2023-06-15 NOTE — Telephone Encounter (Signed)
 MMM NTBS in May for 6 mos FU needs a Monday RF sent to pharmacy

## 2023-06-15 NOTE — Telephone Encounter (Signed)
 N/A NO VM Letter mailed

## 2023-08-22 ENCOUNTER — Other Ambulatory Visit: Payer: Self-pay | Admitting: Nurse Practitioner

## 2023-08-22 DIAGNOSIS — M2550 Pain in unspecified joint: Secondary | ICD-10-CM

## 2023-09-20 ENCOUNTER — Other Ambulatory Visit: Payer: Self-pay | Admitting: Nurse Practitioner

## 2023-09-20 DIAGNOSIS — F339 Major depressive disorder, recurrent, unspecified: Secondary | ICD-10-CM

## 2023-09-21 ENCOUNTER — Other Ambulatory Visit: Payer: Self-pay | Admitting: Nurse Practitioner

## 2023-09-21 DIAGNOSIS — M2550 Pain in unspecified joint: Secondary | ICD-10-CM

## 2023-10-02 ENCOUNTER — Ambulatory Visit: Payer: Self-pay | Admitting: Nurse Practitioner

## 2023-10-03 ENCOUNTER — Encounter: Payer: Self-pay | Admitting: Nurse Practitioner

## 2023-10-13 ENCOUNTER — Other Ambulatory Visit: Payer: Self-pay | Admitting: Nurse Practitioner

## 2023-10-13 DIAGNOSIS — F339 Major depressive disorder, recurrent, unspecified: Secondary | ICD-10-CM

## 2023-10-23 ENCOUNTER — Encounter: Payer: Self-pay | Admitting: Nurse Practitioner

## 2023-10-23 ENCOUNTER — Ambulatory Visit (INDEPENDENT_AMBULATORY_CARE_PROVIDER_SITE_OTHER): Payer: Self-pay | Admitting: Nurse Practitioner

## 2023-10-23 VITALS — BP 123/74 | HR 88 | Temp 97.3°F | Ht 63.0 in | Wt 171.0 lb

## 2023-10-23 DIAGNOSIS — K219 Gastro-esophageal reflux disease without esophagitis: Secondary | ICD-10-CM

## 2023-10-23 DIAGNOSIS — F339 Major depressive disorder, recurrent, unspecified: Secondary | ICD-10-CM

## 2023-10-23 MED ORDER — ESCITALOPRAM OXALATE 20 MG PO TABS: 20.0000 mg | ORAL_TABLET | Freq: Every day | ORAL | 0 refills | Status: AC

## 2023-10-23 NOTE — Progress Notes (Signed)
 Subjective:    Patient ID: Crystal Mata, female    DOB: 01/02/1961, 63 y.o.   MRN: [id number]   Chief Complaint: medical management of chronic issues     HPI:  Crystal Mata is a 63 y.o. who identifies as a female who was assigned female at birth.   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 Anxiety  2. Depression, recurrent (HCC)  3. Panic attack Patient has long history of panic attacks, depression and anxiety. She is currently on just lexapro . She still occasionally has panic attacks. She actually has stopped taking klonopin .      10/23/2023    2:26 PM 01/23/2023    9:41 AM 04/04/2022   11:58 AM 03/05/2021    4:06 PM  GAD 7 : Generalized Anxiety Score  Nervous, Anxious, on Edge 2 2 1 1   Control/stop worrying 2 1 2 1   Worry too much - different things 2 1 2 1   Trouble relaxing 2 1 2  0  Restless 0 0 1 0  Easily annoyed or irritable 2 1 1 1   Afraid - awful might happen 0 1 1 0  Total GAD 7 Score 10 7 10 4   Anxiety Difficulty Somewhat difficult Somewhat difficult Somewhat difficult Somewhat difficult    Having burning abdominal pain almost daily. Is intermittent. Usually occurring prior to eating. Has been on prilosec which seems to help. Patient has no insurance and doe snot want to do work up.    New complaints: None today  No Known Allergies Outpatient Encounter Medications as of 10/23/2023  Medication Sig   diazepam  (VALIUM ) 5 MG tablet Take 1 tablet (5 mg total) by mouth every 12 (twelve) hours as needed for anxiety.   diclofenac  (VOLTAREN ) 75 MG EC tablet Take 1 tablet (75 mg total) by mouth 2 (two) times daily. **NEEDS TO BE SEEN BEFORE NEXT REFILL**   escitalopram  (LEXAPRO ) 20 MG tablet Take 1 tablet (20 mg total) by mouth daily.   No facility-administered encounter medications on file as of 10/23/2023.    Past Surgical  History:  Procedure Laterality Date   TONSILLECTOMY      Family History  Problem Relation Age of Onset   Hypertension Mother    Breast cancer Mother        Partial mastecomy   Heart attack Mother    Diabetic kidney disease Father       Controlled substance contract: n/a     Review of Systems  Constitutional:  Negative for diaphoresis.  Eyes:  Negative for pain.  Respiratory:  Negative for shortness of breath.   Cardiovascular:  Negative for chest pain, palpitations and leg swelling.  Gastrointestinal:  Negative for abdominal pain.  Endocrine: Negative for polydipsia.  Skin:  Negative for rash.  Neurological:  Negative for dizziness, weakness and headaches.  Hematological:  Does not bruise/bleed easily.  All other systems reviewed and are negative.      Objective:   Physical Exam Constitutional:      Appearance: Normal appearance.  Cardiovascular:     Rate and Rhythm: Normal rate and regular rhythm.     Heart sounds: Normal heart sounds.  Pulmonary:     Effort: Pulmonary effort is normal.     Breath sounds: Normal breath sounds.  Abdominal:     Tenderness: There is abdominal tenderness (RUQ pain).  Skin:  General: Skin is warm.  Neurological:     General: No focal deficit present.     Mental Status: She is alert and oriented to person, place, and time.  Psychiatric:        Mood and Affect: Mood normal.        Behavior: Behavior normal.    BP 123/74   Pulse 88   Temp (!) 97.3 F (36.3 C) (Temporal)   Ht 5' 3 (1.6 m)   Wt 171 lb (77.6 kg)   SpO2 95%   BMI 30.29 kg/m          Assessment & Plan:   Jerel Robson in today with chief complaint of medical management of chronic issues    1. Anxiety  2. Depression, recurrent (HCC)  3. Panic attack Continue lexapro  Stress management  4. Abdominal pain Probable GERD or gallbladder disease Continue prilosec daily Avoid greasy and fatty foods Need 6 small meals a day RTO prn   The  above assessment and management plan was discussed with the patient. The patient verbalized understanding of and has agreed to the management plan. Patient is aware to call the clinic if symptoms persist or worsen. Patient is aware when to return to the clinic for a follow-up visit. Patient educated on when it is appropriate to go to the emergency department.   Mary-Margaret Gladis, FNP

## 2023-10-23 NOTE — Patient Instructions (Signed)
 GERD in Adults: Diet Changes When you have gastroesophageal reflux disease (GERD), you may need to make changes to your diet. Choosing the right foods can help with your symptoms. Think about working with an expert in healthy eating called a dietitian. They can help you make healthy food choices. What are tips for following this plan? Reading food labels Look for foods that are low in saturated fat. Foods that may help with your symptoms include: Foods with less than 5% of daily value (DV) of fat. Foods with 0 grams of trans fat. Cooking [employer] in ways that don't use a lot of fat. These ways include: Baking. Steaming. Grilling. Broiling. To add flavor, try to use herbs that are low in spice and acidity. Avoid frying your food. Meal planning  Eat small meals often rather than eating 3 large meals each day. Eat your meals slowly in a place where you feel relaxed. If told by your health care provider, avoid: Foods that cause symptoms. Keep a food diary to keep track of foods that cause symptoms. Alcohol. Drinking a lot of liquid with meals. General instructions For 2-3 hours after you eat, avoid: Bending over. Exercise. Lying down. Chew sugar-free gum after meals. What foods should I eat? Eat a healthy diet. Try to include: Foods with high amounts of fiber. These include: Fruits and vegetables. Whole grains and beans. Low-fat dairy products. Lean meats, fish, and poultry. Egg whites. Foods that cause symptoms in someone else may not cause symptoms for you. Work with your provider to find foods that are safe for you. The items listed above may not be all the foods and drinks you can have. Talk with a dietitian to learn more. The items listed above may not be a complete list of foods and beverages you can eat and drink. Contact a dietitian for more information. What foods should I avoid? Limiting some of these foods may help with your symptoms. Each person is different.  Talk with a dietitian or your provider to help you find the exact foods to avoid. Some of the foods to avoid may include: Fruits Fruits with a lot of acid in them. These may include citrus fruits, such as oranges, grapefruit, pineapple, and lemons. Vegetables Deep-fried vegetables, such as [place] fries. Vegetables, sauces, or toppings made with added fat and vegetables with acid in them. These may include tomatoes and tomato products, chili peppers, onions, garlic, and horseradish. Grains Pastries or quick breads with added fat. Meats and other proteins High-fat meats, such as fatty beef or pork, hot dogs, ribs, ham, sausage, salami, and bacon. Fried meat or protein, such as fried fish and fried chicken. Egg yolks. Fats and oils Butter. Margarine. Shortening. Ghee. Drinks Coffee and other drinks with caffeine in them. Fizzy and sugary drinks, such as soda and energy drinks. Fruit juice made with acidic fruits, such as orange or grapefruit. Tomato juice. Sweets and desserts Chocolate and cocoa. Donuts. Seasonings and condiments Mint, such as peppermint and spearmint. Condiments, herbs, or seasonings that cause symptoms. These may include curry, hot sauce, or vinegar-based salad dressings. The items listed above may not be all the foods and drinks you should avoid. Talk with a dietitian to learn more. Questions to ask your health care provider Changes to your diet and everyday life are often the first steps taken to manage symptoms of GERD. If these changes don't help, talk with your provider about taking medicines. Where to find more information [employer] G[employer]intestinal [employer]:  aboutgerd.org This information is not intended to replace advice given to you by your health care provider. Make sure you discuss any questions you have with your health care provider. Document Revised: 12/27/2022 Document Reviewed: 07/13/2022 Elsevier Patient Education  2024 [employer].

## 2023-11-01 ENCOUNTER — Other Ambulatory Visit: Payer: Self-pay | Admitting: Nurse Practitioner

## 2023-11-01 DIAGNOSIS — M2550 Pain in unspecified joint: Secondary | ICD-10-CM

## 2023-12-12 ENCOUNTER — Other Ambulatory Visit: Payer: Self-pay | Admitting: Nurse Practitioner

## 2023-12-12 DIAGNOSIS — M2550 Pain in unspecified joint: Secondary | ICD-10-CM

## 2024-01-24 ENCOUNTER — Ambulatory Visit: Payer: Self-pay | Admitting: Family Medicine

## 2024-01-24 ENCOUNTER — Ambulatory Visit: Payer: Self-pay

## 2024-01-24 NOTE — Telephone Encounter (Signed)
 1st attempt to reach patient. Mailbox full unable to leave message.  Copied from CRM #[id number]. 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 No pain/falls recently.

## 2024-01-24 NOTE — Telephone Encounter (Signed)
 3rd attempt to reach patient. [employer] screening service answered, unable to leave message. Third attempt to contact pt, will route to office for call back.

## 2024-01-24 NOTE — Telephone Encounter (Signed)
 2nd attempt to reach patient. [employer] screening service answered, this RN stated name and office she was calling from, phone stayed silent, unable to leave voicemail.

## 2024-01-24 NOTE — Telephone Encounter (Signed)
 Spoke with patient earlier and advised ER. Pt agreeable to ER canceled appt she had scheduled.
